# Patient Record
Sex: Female | Born: 1955 | Race: Black or African American | Hispanic: No | Marital: Single | Smoking: Never smoker
Health system: Southern US, Community
[De-identification: ages and names within clinical notes are randomized; demographics above are authoritative.]

## PROBLEM LIST (undated history)

## (undated) DIAGNOSIS — J45909 Unspecified asthma, uncomplicated: Secondary | ICD-10-CM

## (undated) DIAGNOSIS — I10 Essential (primary) hypertension: Secondary | ICD-10-CM

## (undated) DIAGNOSIS — J302 Other seasonal allergic rhinitis: Secondary | ICD-10-CM

## (undated) DIAGNOSIS — M1990 Unspecified osteoarthritis, unspecified site: Secondary | ICD-10-CM

## (undated) HISTORY — DX: Unspecified osteoarthritis, unspecified site: M19.90

## (undated) HISTORY — DX: Other seasonal allergic rhinitis: J30.2

## (undated) HISTORY — DX: Essential (primary) hypertension: I10

## (undated) HISTORY — PX: TUBAL LIGATION: SHX77

## (undated) HISTORY — DX: Unspecified asthma, uncomplicated: J45.909

---

## 2012-10-12 ENCOUNTER — Ambulatory Visit (INDEPENDENT_AMBULATORY_CARE_PROVIDER_SITE_OTHER): Payer: No Typology Code available for payment source | Admitting: Physician Assistant

## 2012-10-12 VITALS — BP 142/94 | HR 87 | Temp 98.2°F | Resp 18 | Ht 62.0 in | Wt 190.0 lb

## 2012-10-12 DIAGNOSIS — J9801 Acute bronchospasm: Secondary | ICD-10-CM

## 2012-10-12 DIAGNOSIS — R062 Wheezing: Secondary | ICD-10-CM

## 2012-10-12 DIAGNOSIS — J45901 Unspecified asthma with (acute) exacerbation: Secondary | ICD-10-CM

## 2012-10-12 DIAGNOSIS — J40 Bronchitis, not specified as acute or chronic: Secondary | ICD-10-CM

## 2012-10-12 DIAGNOSIS — R05 Cough: Secondary | ICD-10-CM

## 2012-10-12 DIAGNOSIS — I10 Essential (primary) hypertension: Secondary | ICD-10-CM

## 2012-10-12 MED ORDER — ALBUTEROL SULFATE HFA 108 (90 BASE) MCG/ACT IN AERS: 2.0000 | INHALATION_SPRAY | Freq: Four times a day (QID) | RESPIRATORY_TRACT | Status: DC | PRN

## 2012-10-12 MED ORDER — IPRATROPIUM BROMIDE 0.02 % IN SOLN
0.5000 mg | Freq: Once | RESPIRATORY_TRACT | Status: AC
  Administered 2012-10-12: 0.5 mg via RESPIRATORY_TRACT

## 2012-10-12 MED ORDER — IPRATROPIUM BROMIDE 0.06 % NA SOLN: 2.0000 | Freq: Three times a day (TID) | NASAL | Status: DC

## 2012-10-12 MED ORDER — CEFDINIR 300 MG PO CAPS: 300.0000 mg | ORAL_CAPSULE | Freq: Two times a day (BID) | ORAL | Status: DC

## 2012-10-12 MED ORDER — HYDROCODONE-HOMATROPINE 5-1.5 MG/5ML PO SYRP: ORAL_SOLUTION | ORAL | Status: DC

## 2012-10-12 MED ORDER — ALBUTEROL SULFATE (2.5 MG/3ML) 0.083% IN NEBU
2.5000 mg | INHALATION_SOLUTION | Freq: Once | RESPIRATORY_TRACT | Status: AC
  Administered 2012-10-12: 2.5 mg via RESPIRATORY_TRACT

## 2012-10-12 MED ORDER — HYDROCHLOROTHIAZIDE 25 MG PO TABS
25.0000 mg | ORAL_TABLET | Freq: Every day | ORAL | Status: DC
Start: 2012-10-12 — End: 2013-07-19

## 2012-10-12 MED ORDER — AMLODIPINE BESYLATE 10 MG PO TABS: 10.0000 mg | ORAL_TABLET | Freq: Every day | ORAL | Status: DC

## 2012-10-12 NOTE — Progress Notes (Signed)
Patient ID: Belinda Rodriguez MRN: [id number], DOB: [birth year], 57 y.o. Date of Encounter: 10/12/2012, 12:47 PM  Primary Physician: No primary provider on file.  Chief Complaint:  Chief Complaint  Patient presents with  . Cough    x yesterday  . Sinus Problem    pressure, ears itching    HPI: 57 y.o. female with history of well controlled allergies/asthma presents with a one day history of nasal congestion, post nasal drip, scratchy throat, and cough. Mild sinus pressure. Afebrile. No chills. Nasal congestion thick and green/yellow. Cough is productive of green/yellow sputum and worse as the day goes on. Mild wheezing. No chest pain or shortness of breath. Normal hearing. No otalgia. Has her albuterol inhaler 3 times the previous day with some success. She last used it the previous evening. No GI complaints. Appetite a little down today secondary to the increased post nasal drip. She does have a sick grand child. No recent antibiotics or recent travels. No leg trauma, sedentary periods, h/o cancer, or tobacco use.  Of note, her asthma typically only flares when her allergies become active which is around this time of year. At baseline she does not require the use of her albuterol inhaler. She requests a refill of her albuterol inhaler today. She also requests a refill of her antihypertensives as she has recently relocated her from near the [facility] [place] to help with her daughter's day-to-day tasks as her daughter treats her breast cancer.   Past Medical History  Diagnosis Date  . Arthritis   . Hypertension   . Asthma   . Seasonal allergies      Home Meds: Prior to Admission medications   Medication Sig Start Date End Date Taking? Authorizing Provider  albuterol (PROVENTIL HFA;VENTOLIN HFA) 108 (90 BASE) MCG/ACT inhaler Inhale 2 puffs into the lungs every 6 (six) hours as needed for wheezing.   Yes Historical Provider, MD  amLODipine (NORVASC) 10 MG tablet Take 10 mg by mouth daily.   Yes  Historical Provider, MD  Diclofenac-Misoprostol (ARTHROTEC PO) Take by mouth.   Yes Historical Provider, MD  hydrochlorothiazide (HYDRODIURIL) 25 MG tablet Take 25 mg by mouth daily.   Yes Historical Provider, MD  loratadine (CLARITIN) 10 MG tablet Take 10 mg by mouth daily.   Yes Historical Provider, MD  OMEPRAZOLE PO Take by mouth.   Yes Historical Provider, MD    Allergies:  Allergies  Allergen Reactions  . Nsaids   . Zestril (Lisinopril)     History   Social History  . Marital Status: Single    Spouse Name: N/A    Number of Children: N/A  . Years of Education: N/A   Occupational History  . Not on file.   Social History Main Topics  . Smoking status: Never Smoker   . Smokeless tobacco: Not on file  . Alcohol Use: No  . Drug Use: No  . Sexually Active: No   Other Topics Concern  . Not on file   Social History Narrative  . No narrative on file     Review of Systems: Constitutional: positive for fatigue. negative for chills, fever, night sweats, or weight changes HEENT: see above Cardiovascular: negative for chest pain or palpitations Respiratory: positive for cough and wheezing. negative for hemoptysis, tachypnea, dyspnea, or shortness of breath Abdominal: negative for abdominal pain, nausea, vomiting or diarrhea Dermatological: negative for rash Neurologic: negative for headache   Physical Exam: Blood pressure 142/94, pulse 87, temperature 98.2 F (36.8 C), temperature  source Oral, resp. rate 18, height 5\' 2"  (1.575 m), weight 190 lb (86.183 kg), SpO2 97.00%., Body mass index is 34.74 kg/(m^2). General: Well developed, well nourished, in no acute distress. Well appearing.  Head: Normocephalic, atraumatic, eyes without discharge, sclera non-icteric, nares are congested. No polyps. Bilateral auditory canals clear, TM's are without perforation, pearly grey with reflective cone of light bilaterally. Right maxillary sinus TTP. No frontal sinus TTP. Oral cavity moist,  dentition normal. Posterior pharynx with post nasal drip and mild erythema. No peritonsillar abscess or tonsillar exudate. Uvula midline.  Neck: Supple. No thyromegaly. Full ROM. No lymphadenopathy. Lungs: Coarse breath sounds bilaterally with mild expiratory wheezing. No rales or rhonchi. Breathing is unlabored. Status post albuterol and atrovent nebulizer patient feels much better. Lungs CTAB without wheezing.  Heart: RRR with S1 S2. No murmurs, rubs, or gallops appreciated. Msk:  Strength and tone normal for age. Extremities: No clubbing or cyanosis. No edema. Neuro: Alert and oriented X 3. Moves all extremities spontaneously. CNII-XII grossly in tact. Psych:  Responds to questions appropriately with a normal affect.     ASSESSMENT AND PLAN:  57 y.o. year old female with bronchitis/bronchospasm/cough and hypertension.  1) Bronchitis/bronchospasm/cough -Albuterol and Atrovent nebulizer in office  -Omnicef 300 mg 1 po bid #20 no RF  -Proventil 2 puffs inhaled q 4-6 hours prn #1 no RF -Hycodan #4oz 1 tsp po q 4-6 hours prn cough no RF SED -Atrovent NS 0.06% 2 sprays each nare bid prn #1 no RF -Mucinex -Tylenol/Motrin prn -Rest/fluids -RTC precautions -RTC 3-5 days if no improvement  2) Hypertension -Refilled meds until she can become established  -Norvasc 10 mg 1 po daily #30 RF 2 -HCTZ 25 mg 1 po daily #30 RF 2  Signed, Eula Listen, PA-C 10/12/2012 12:47 PM

## 2013-06-02 ENCOUNTER — Ambulatory Visit: Payer: Self-pay | Attending: Internal Medicine

## 2013-06-02 ENCOUNTER — Encounter (INDEPENDENT_AMBULATORY_CARE_PROVIDER_SITE_OTHER): Payer: Self-pay

## 2013-06-03 DIAGNOSIS — Z0271 Encounter for disability determination: Secondary | ICD-10-CM

## 2013-07-05 ENCOUNTER — Emergency Department (HOSPITAL_COMMUNITY)
Admission: EM | Admit: 2013-07-05 | Discharge: 2013-07-05 | Disposition: A | Discharge status: Home / Self Care | Payer: No Typology Code available for payment source | Source: Home / Self Care | Attending: Family Medicine | Admitting: Family Medicine

## 2013-07-05 ENCOUNTER — Encounter (HOSPITAL_COMMUNITY): Payer: Self-pay | Admitting: Emergency Medicine

## 2013-07-05 DIAGNOSIS — J019 Acute sinusitis, unspecified: Secondary | ICD-10-CM

## 2013-07-05 LAB — POCT RAPID STREP A: Streptococcus, Group A Screen (Direct): NEGATIVE

## 2013-07-05 MED ORDER — AMLODIPINE BESYLATE 10 MG PO TABS: 10.0000 mg | ORAL_TABLET | Freq: Every day | ORAL | Status: DC

## 2013-07-05 MED ORDER — HYDROCHLOROTHIAZIDE 25 MG PO TABS: 25.0000 mg | ORAL_TABLET | Freq: Every day | ORAL | Status: DC

## 2013-07-05 MED ORDER — MINOCYCLINE HCL 100 MG PO CAPS: 100.0000 mg | ORAL_CAPSULE | Freq: Two times a day (BID) | ORAL | Status: DC

## 2013-07-05 NOTE — ED Notes (Signed)
C/o sinus problems since Friday Admits nasal drainage, headache, sore throat, and cough

## 2013-07-05 NOTE — ED Provider Notes (Signed)
CSN: [id number]     Arrival date & time 07/05/13  1700 History   First MD Initiated Contact with Patient 07/05/13 1716     Chief Complaint  Patient presents with  . Facial Pain   (Consider location/radiation/quality/duration/timing/severity/associated sxs/prior Treatment) Patient is a 57 y.o. female presenting with URI.  URI Presenting symptoms: congestion, cough, facial pain and rhinorrhea   Presenting symptoms: no fever   Severity:  Mild Onset quality:  Gradual Duration:  3 days Progression:  Unchanged Chronicity:  New Relieved by:  None tried Worsened by:  Nothing tried Ineffective treatments:  None tried Associated symptoms: sinus pain     Past Medical History  Diagnosis Date  . Arthritis   . Hypertension   . Asthma   . Seasonal allergies    Past Surgical History  Procedure Laterality Date  . Tubal ligation     Family History  Problem Relation Age of Onset  . Hypertension Sister   . Diabetes Sister   . Hypertension Brother   . Cancer Daughter     breast   History  Substance Use Topics  . Smoking status: Never Smoker   . Smokeless tobacco: Not on file  . Alcohol Use: No   OB History   Grav Para Term Preterm Abortions TAB SAB Ect Mult Living                 Review of Systems  Constitutional: Negative.  Negative for fever.  HENT: Positive for congestion, postnasal drip and rhinorrhea. Negative for facial swelling.   Respiratory: Positive for cough.     Allergies  Nsaids and Zestril  Home Medications   Current Outpatient Rx  Name  Route  Sig  Dispense  Refill  . albuterol (PROVENTIL HFA;VENTOLIN HFA) 108 (90 BASE) MCG/ACT inhaler   Inhalation   Inhale 2 puffs into the lungs every 6 (six) hours as needed for wheezing.   1 Inhaler   2   . amLODipine (NORVASC) 10 MG tablet   Oral   Take 1 tablet (10 mg total) by mouth daily.   30 tablet   2   . cefdinir (OMNICEF) 300 MG capsule   Oral   Take 1 capsule (300 mg total) by mouth 2 (two) times  daily.   20 capsule   0   . Diclofenac-Misoprostol (ARTHROTEC PO)   Oral   Take by mouth.         . hydrochlorothiazide (HYDRODIURIL) 25 MG tablet   Oral   Take 1 tablet (25 mg total) by mouth daily.   30 tablet   2   . HYDROcodone-homatropine (HYCODAN) 5-1.5 MG/5ML syrup      1 TSP PO Q 4-6 HOURS PRN COUGH   120 mL   0   . ipratropium (ATROVENT) 0.06 % nasal spray   Nasal   Place 2 sprays into the nose 3 (three) times daily.   15 mL   0   . loratadine (CLARITIN) 10 MG tablet   Oral   Take 10 mg by mouth daily.         . minocycline (MINOCIN,DYNACIN) 100 MG capsule   Oral   Take 1 capsule (100 mg total) by mouth 2 (two) times daily.   20 capsule   0   . OMEPRAZOLE PO   Oral   Take by mouth.          BP 157/101  Pulse 115  Temp(Src) 98.3 F (36.8 C) (Oral)  Resp 20  SpO2  100% Physical Exam  Nursing note and vitals reviewed. Constitutional: She is oriented to person, place, and time. She appears well-developed and well-nourished.  HENT:  Head: Normocephalic.  Right Ear: External ear normal.  Left Ear: External ear normal.  Nose: Mucosal edema, rhinorrhea and sinus tenderness present. Right sinus exhibits maxillary sinus tenderness. Left sinus exhibits maxillary sinus tenderness.  Mouth/Throat: Oropharynx is clear and moist.  Neck: Normal range of motion. Neck supple.  Cardiovascular: Normal rate.   Pulmonary/Chest: Effort normal and breath sounds normal.  Lymphadenopathy:    She has no cervical adenopathy.  Neurological: She is alert and oriented to person, place, and time.  Skin: Skin is warm and dry.    ED Course  Procedures (including critical care time) Labs Review Labs Reviewed  POCT RAPID STREP A ([facility]ARE ONLY)   Imaging Review No results found.  EKG Interpretation    Date/Time:    Ventricular Rate:    PR Interval:    QRS Duration:   QT Interval:    QTC Calculation:   R Axis:     Text Interpretation:               MDM      Linna Hoff, MD 07/05/13 [phone]

## 2013-07-07 LAB — CULTURE, GROUP A STREP

## 2013-07-19 ENCOUNTER — Encounter: Payer: Self-pay | Admitting: Internal Medicine

## 2013-07-19 ENCOUNTER — Ambulatory Visit: Payer: No Typology Code available for payment source | Attending: Internal Medicine | Admitting: Internal Medicine

## 2013-07-19 VITALS — BP 134/90 | HR 108 | Temp 98.9°F | Resp 16 | Ht 62.0 in | Wt 206.0 lb

## 2013-07-19 DIAGNOSIS — J45901 Unspecified asthma with (acute) exacerbation: Secondary | ICD-10-CM | POA: Insufficient documentation

## 2013-07-19 DIAGNOSIS — J329 Chronic sinusitis, unspecified: Secondary | ICD-10-CM | POA: Insufficient documentation

## 2013-07-19 DIAGNOSIS — R059 Cough, unspecified: Secondary | ICD-10-CM | POA: Insufficient documentation

## 2013-07-19 DIAGNOSIS — R0982 Postnasal drip: Secondary | ICD-10-CM

## 2013-07-19 DIAGNOSIS — R05 Cough: Secondary | ICD-10-CM

## 2013-07-19 DIAGNOSIS — I10 Essential (primary) hypertension: Secondary | ICD-10-CM | POA: Insufficient documentation

## 2013-07-19 DIAGNOSIS — K219 Gastro-esophageal reflux disease without esophagitis: Secondary | ICD-10-CM

## 2013-07-19 DIAGNOSIS — M1990 Unspecified osteoarthritis, unspecified site: Secondary | ICD-10-CM

## 2013-07-19 DIAGNOSIS — R062 Wheezing: Secondary | ICD-10-CM

## 2013-07-19 LAB — CMP AND LIVER
ALT: 19 U/L (ref 0–35)
Albumin: 3.8 g/dL (ref 3.5–5.2)
Alkaline Phosphatase: 96 U/L (ref 39–117)
BUN: 15 mg/dL (ref 6–23)
Bilirubin, Direct: 0.1 mg/dL (ref 0.0–0.3)
CO2: 30 mEq/L (ref 19–32)
Glucose, Bld: 94 mg/dL (ref 70–99)
Indirect Bilirubin: 0.3 mg/dL (ref 0.0–0.9)
Potassium: 4.2 mEq/L (ref 3.5–5.3)
Sodium: 140 mEq/L (ref 135–145)
Total Protein: 7.5 g/dL (ref 6.0–8.3)

## 2013-07-19 LAB — CBC WITH DIFFERENTIAL/PLATELET
Basophils Absolute: 0.1 10*3/uL (ref 0.0–0.1)
Basophils Relative: 1 % (ref 0–1)
Eosinophils Absolute: 0.1 10*3/uL (ref 0.0–0.7)
Eosinophils Relative: 2 % (ref 0–5)
HCT: 37 % (ref 36.0–46.0)
Lymphocytes Relative: 47 % — ABNORMAL HIGH (ref 12–46)
Lymphs Abs: 2.4 10*3/uL (ref 0.7–4.0)
MCH: 30.6 pg (ref 26.0–34.0)
MCHC: 34.9 g/dL (ref 30.0–36.0)
Monocytes Absolute: 0.5 10*3/uL (ref 0.1–1.0)
Monocytes Relative: 9 % (ref 3–12)
Neutro Abs: 2 10*3/uL (ref 1.7–7.7)
Neutrophils Relative %: 41 % — ABNORMAL LOW (ref 43–77)
RBC: 4.22 MIL/uL (ref 3.87–5.11)
RDW: 13.2 % (ref 11.5–15.5)

## 2013-07-19 LAB — LIPID PANEL
HDL: 54 mg/dL (ref >39)
LDL Cholesterol: 133 mg/dL — ABNORMAL HIGH (ref 0–99)
VLDL: 17 mg/dL (ref 0–40)

## 2013-07-19 LAB — TSH: TSH: 1.513 u[IU]/mL (ref 0.350–4.500)

## 2013-07-19 MED ORDER — AMLODIPINE BESYLATE 10 MG PO TABS: 10.0000 mg | ORAL_TABLET | Freq: Every day | ORAL | Status: DC

## 2013-07-19 MED ORDER — IPRATROPIUM BROMIDE 0.06 % NA SOLN: 2.0000 | Freq: Three times a day (TID) | NASAL | Status: DC

## 2013-07-19 MED ORDER — DICLOFENAC-MISOPROSTOL 75-0.2 MG PO TBEC: 1.0000 | DELAYED_RELEASE_TABLET | Freq: Two times a day (BID) | ORAL | Status: DC

## 2013-07-19 MED ORDER — ALBUTEROL SULFATE HFA 108 (90 BASE) MCG/ACT IN AERS: 2.0000 | INHALATION_SPRAY | Freq: Four times a day (QID) | RESPIRATORY_TRACT | Status: DC | PRN

## 2013-07-19 MED ORDER — OMEPRAZOLE 20 MG PO CPDR: 20.0000 mg | DELAYED_RELEASE_CAPSULE | Freq: Two times a day (BID) | ORAL | Status: DC

## 2013-07-19 MED ORDER — LORATADINE 10 MG PO TABS: 10.0000 mg | ORAL_TABLET | Freq: Every day | ORAL | Status: DC

## 2013-07-19 MED ORDER — HYDROCHLOROTHIAZIDE 25 MG PO TABS: 25.0000 mg | ORAL_TABLET | Freq: Every day | ORAL | Status: DC

## 2013-07-19 NOTE — Progress Notes (Signed)
Patient ID: Zonia Caplin, female   DOB: October 15, 1955, 57 y.o.   MRN: [id number] Patient Demographics  Belinda Rodriguez, is a 57 y.o. female  WUJ:[id number]  NFA:[id number]  DOB - 03-30-1956  CC:  Chief Complaint  Patient presents with  . Establish Care       HPI: Belinda Rodriguez is a 57 y.o. female here today to establish medical care. Patient's past medical history significant for hypertension, asthma, osteoarthritis of the knee joints and seasonal allergies. She has not had primary care physician for a while, has not been on medications since then. She would like a refill of all her medications, in the proper physical examination. Her major complaint today is ongoing knee pains for which she takes pain medication but has run out. She claims that her blood pressure has been fairly within normal limits, her cough and wheezing has started to get worse than before, she usually gets it worse around this time of the year, but usually gets better with her breathing treatment which she doesn't have at the moment. Her daughter has breast cancer now doing better after surgery and chemotherapy. She does not smoke cigarette. Patient has No headache, No chest pain, No abdominal pain - No Nausea, No new weakness tingling or numbness, No Cough - SOB.  Allergies  Allergen Reactions  . Nsaids   . Zestril [Lisinopril]    Past Medical History  Diagnosis Date  . Arthritis   . Hypertension   . Asthma   . Seasonal allergies    Current Outpatient Prescriptions on File Prior to Visit  Medication Sig Dispense Refill  . cefdinir (OMNICEF) 300 MG capsule Take 1 capsule (300 mg total) by mouth 2 (two) times daily.  20 capsule  0  . HYDROcodone-homatropine (HYCODAN) 5-1.5 MG/5ML syrup 1 TSP PO Q 4-6 HOURS PRN COUGH  120 mL  0  . minocycline (MINOCIN,DYNACIN) 100 MG capsule Take 1 capsule (100 mg total) by mouth 2 (two) times daily.  20 capsule  0   No current facility-administered medications on file prior to visit.    Family History  Problem Relation Age of Onset  . Hypertension Sister   . Diabetes Sister   . Hypertension Brother   . Cancer Daughter     breast   History   Social History  . Marital Status: Single    Spouse Name: N/A    Number of Children: N/A  . Years of Education: N/A   Occupational History  . Not on file.   Social History Main Topics  . Smoking status: Never Smoker   . Smokeless tobacco: Not on file  . Alcohol Use: No  . Drug Use: No  . Sexual Activity: No   Other Topics Concern  . Not on file   Social History Narrative  . No narrative on file    Review of Systems: Constitutional: Negative for fever, chills, diaphoresis, activity change, appetite change and fatigue. HENT: Negative for ear pain, nosebleeds, congestion, facial swelling, rhinorrhea, neck pain, neck stiffness and ear discharge.  Eyes: Negative for pain, discharge, redness, itching and visual disturbance. Respiratory: Negative for cough, choking, chest tightness, shortness of breath, wheezing and stridor.  Cardiovascular: Negative for chest pain, palpitations and leg swelling. Gastrointestinal: Negative for abdominal distention. Genitourinary: Negative for dysuria, urgency, frequency, hematuria, flank pain, decreased urine volume, difficulty urinating and dyspareunia.  Musculoskeletal: Negative for back pain, joint swelling, arthralgia and gait problem. Neurological: Negative for dizziness, tremors, seizures, syncope, facial asymmetry, speech difficulty, weakness, light-headedness, numbness  and headaches.  Hematological: Negative for adenopathy. Does not bruise/bleed easily. Psychiatric/Behavioral: Negative for hallucinations, behavioral problems, confusion, dysphoric mood, decreased concentration and agitation.    Objective:   Filed Vitals:   07/19/13 1055  BP: 134/90  Pulse: 108  Temp: 98.9 F (37.2 C)  Resp: 16    Physical Exam: Constitutional: Patient appears well-developed and  well-nourished. No distress. HENT: Normocephalic, atraumatic, External right and left ear normal. Oropharynx is clear and moist.  Eyes: Conjunctivae and EOM are normal. PERRLA, no scleral icterus. Neck: Normal ROM. Neck supple. No JVD. No tracheal deviation. No thyromegaly. CVS: RRR, S1/S2 +, no murmurs, no gallops, no carotid bruit.  Pulmonary: Effort and breath sounds normal, no stridor, rhonchi, wheezes, rales.  Abdominal: Soft. BS +, no distension, tenderness, rebound or guarding.  Musculoskeletal: Normal range of motion. No edema and no tenderness.  Lymphadenopathy: No lymphadenopathy noted, cervical, inguinal or axillary Neuro: Alert. Normal reflexes, muscle tone coordination. No cranial nerve deficit. Skin: Skin is warm and dry. No rash noted. Not diaphoretic. No erythema. No pallor. Psychiatric: Normal mood and affect. Behavior, judgment, thought content normal.  No results found for this basename: WBC, HGB, HCT, MCV, PLT   No results found for this basename: CREATININE, BUN, NA, K, CL, CO2    No results found for this basename: HGBA1C   Lipid Panel  No results found for this basename: chol, trig, hdl, cholhdl, vldl, ldlcalc       Assessment and plan:   1. Sinusitis, chronic  - loratadine (CLARITIN) 10 MG tablet; Take 1 tablet (10 mg total) by mouth daily.  Dispense: 30 tablet; Refill: 3  2. Postnasal drip Trial of PPI as below Patient counseled  3. GERD (gastroesophageal reflux disease)  - omeprazole (PRILOSEC) 20 MG capsule; Take 1 capsule (20 mg total) by mouth 2 (two) times daily before a meal.  Dispense: 120 capsule; Refill: 3  4. Osteoarthritis Refill - Diclofenac-Misoprostol (ARTHROTEC) 75-0.2 MG TBEC; Take 1 tablet by mouth 2 (two) times daily.  Dispense: 60 tablet; Refill: 3  5. Essential hypertension, benign Labs: - CMP and Liver - CBC with Differential - Lipid Panel - Urinalysis, Complete - TSH - hydrochlorothiazide (HYDRODIURIL) 25 MG tablet; Take  1 tablet (25 mg total) by mouth daily.  Dispense: 90 tablet; Refill: 3 - amLODipine (NORVASC) 10 MG tablet; Take 1 tablet (10 mg total) by mouth daily.  Dispense: 90 tablet; Refill: 3  6. Cough  - ipratropium (ATROVENT) 0.06 % nasal spray; Place 2 sprays into the nose 3 (three) times daily.  Dispense: 15 mL; Refill: 3 - albuterol (PROVENTIL HFA;VENTOLIN HFA) 108 (90 BASE) MCG/ACT inhaler; Inhale 2 puffs into the lungs every 6 (six) hours as needed for wheezing.  Dispense: 2 Inhaler; Refill: 3  7. Wheezing  - albuterol (PROVENTIL HFA;VENTOLIN HFA) 108 (90 BASE) MCG/ACT inhaler; Inhale 2 puffs into the lungs every 6 (six) hours as needed for wheezing.  Dispense: 2 Inhaler; Refill: 3  8. Asthma with acute exacerbation  - albuterol (PROVENTIL HFA;VENTOLIN HFA) 108 (90 BASE) MCG/ACT inhaler; Inhale 2 puffs into the lungs every 6 (six) hours as needed for wheezing.  Dispense: 2 Inhaler; Refill: 3   Follow up in 3 months or when necessary   The patient was given clear instructions to go to ER or return to medical center if symptoms don't improve, worsen or new problems develop. The patient verbalized understanding. The patient was told to call to get lab results if they haven't heard anything in  the next week.     Jeanann Lewandowsky, MD, MHA, [facility] [place], [place] [phone]   07/19/2013, 11:45 AM

## 2013-07-19 NOTE — Progress Notes (Signed)
Pt is here to establish care. Pt is needing to get back on her medication. For 8 years pt has been dealing with pain from her right knee. Pt also reports having allergy's this time of year.

## 2013-07-19 NOTE — Patient Instructions (Signed)
DASH Diet The DASH diet stands for "Dietary Approaches to Stop Hypertension." It is a healthy eating plan that has been shown to reduce high blood pressure (hypertension) in as little as 14 days, while also possibly providing other significant health benefits. These other health benefits include reducing the risk of breast cancer after menopause and reducing the risk of type 2 diabetes, heart disease, colon cancer, and stroke. Health benefits also include weight loss and slowing kidney failure in patients with chronic kidney disease.  DIET GUIDELINES  Limit salt (sodium). Your diet should contain less than 1500 mg of sodium daily.  Limit refined or processed carbohydrates. Your diet should include mostly whole grains. Desserts and added sugars should be used sparingly.  Include small amounts of heart-healthy fats. These types of fats include nuts, oils, and tub margarine. Limit saturated and trans fats. These fats have been shown to be harmful in the body. CHOOSING FOODS  The following food groups are based on a 2000 calorie diet. See your Registered Dietitian for individual calorie needs. Grains and Grain Products (6 to 8 servings daily)  Eat More Often: Whole-wheat bread, brown rice, whole-grain or wheat pasta, quinoa, popcorn without added fat or salt (air popped).  Eat Less Often: White bread, white pasta, white rice, cornbread. Vegetables (4 to 5 servings daily)  Eat More Often: Fresh, frozen, and canned vegetables. Vegetables may be raw, steamed, roasted, or grilled with a minimal amount of fat.  Eat Less Often/Avoid: Creamed or fried vegetables. Vegetables in a cheese sauce. Fruit (4 to 5 servings daily)  Eat More Often: All fresh, canned (in natural juice), or frozen fruits. Dried fruits without added sugar. One hundred percent fruit juice ( cup [237 mL] daily).  Eat Less Often: Dried fruits with added sugar. Canned fruit in light or heavy syrup. Lean Meats, Fish, and Poultry (2  servings or less daily. One serving is 3 to 4 oz [85-114 g]).  Eat More Often: Ninety percent or leaner ground beef, tenderloin, sirloin. Round cuts of beef, chicken breast, turkey breast. All fish. Grill, bake, or broil your meat. Nothing should be fried.  Eat Less Often/Avoid: Fatty cuts of meat, turkey, or chicken leg, thigh, or wing. Fried cuts of meat or fish. Dairy (2 to 3 servings)  Eat More Often: Low-fat or fat-free milk, low-fat plain or light yogurt, reduced-fat or part-skim cheese.  Eat Less Often/Avoid: Milk (whole, 2%).Whole milk yogurt. Full-fat cheeses. Nuts, Seeds, and Legumes (4 to 5 servings per week)  Eat More Often: All without added salt.  Eat Less Often/Avoid: Salted nuts and seeds, canned beans with added salt. Fats and Sweets (limited)  Eat More Often: Vegetable oils, tub margarines without trans fats, sugar-free gelatin. Mayonnaise and salad dressings.  Eat Less Often/Avoid: Coconut oils, palm oils, butter, stick margarine, cream, half and half, cookies, candy, pie. FOR MORE INFORMATION The Dash Diet Eating Plan: www.dashdiet.org Document Released: 07/04/2011 Document Revised: 10/07/2011 Document Reviewed: 07/04/2011 [employer] Patient Information 2014 [employer]. Hypertension As your heart beats, it forces blood through your arteries. This force is your blood pressure. If the pressure is too high, it is called hypertension (HTN) or high blood pressure. HTN is dangerous because you may have it and not know it. High blood pressure may mean that your heart has to work harder to pump blood. Your arteries may be narrow or stiff. The extra work puts you at risk for heart disease, stroke, and other problems.  Blood pressure consists of two numbers, a   higher number over a lower, 110/72, for example. It is stated as "110 over 72." The ideal is below 120 for the top number (systolic) and under 80 for the bottom (diastolic). Write down your blood pressure today. You  should pay close attention to your blood pressure if you have certain conditions such as:  Heart failure.  Prior heart attack.  Diabetes  Chronic kidney disease.  Prior stroke.  Multiple risk factors for heart disease. To see if you have HTN, your blood pressure should be measured while you are seated with your arm held at the level of the heart. It should be measured at least twice. A one-time elevated blood pressure reading (especially in the Emergency Department) does not mean that you need treatment. There may be conditions in which the blood pressure is different between your right and left arms. It is important to see your caregiver soon for a recheck. Most people have essential hypertension which means that there is not a specific cause. This type of high blood pressure may be lowered by changing lifestyle factors such as:  Stress.  Smoking.  Lack of exercise.  Excessive weight.  Drug/tobacco/alcohol use.  Eating less salt. Most people do not have symptoms from high blood pressure until it has caused damage to the body. Effective treatment can often prevent, delay or reduce that damage. TREATMENT  When a cause has been identified, treatment for high blood pressure is directed at the cause. There are a large number of medications to treat HTN. These fall into several categories, and your caregiver will help you select the medicines that are best for you. Medications may have side effects. You should review side effects with your caregiver. If your blood pressure stays high after you have made lifestyle changes or started on medicines,   Your medication(s) may need to be changed.  Other problems may need to be addressed.  Be certain you understand your prescriptions, and know how and when to take your medicine.  Be sure to follow up with your caregiver within the time frame advised (usually within two weeks) to have your blood pressure rechecked and to review your  medications.  If you are taking more than one medicine to lower your blood pressure, make sure you know how and at what times they should be taken. Taking two medicines at the same time can result in blood pressure that is too low. SEEK IMMEDIATE MEDICAL CARE IF:  You develop a severe headache, blurred or changing vision, or confusion.  You have unusual weakness or numbness, or a faint feeling.  You have severe chest or abdominal pain, vomiting, or breathing problems. MAKE SURE YOU:   Understand these instructions.  Will watch your condition.  Will get help right away if you are not doing well or get worse. Document Released: 07/15/2005 Document Revised: 10/07/2011 Document Reviewed: 03/04/2008 [facility] Patient Information 2014 [place], [place]. Asthma Attack Prevention Although there is no way to prevent asthma from starting, you can take steps to control the disease and reduce its symptoms. Learn about your asthma and how to control it. Take an active role to control your asthma by working with your health care provider to create and follow an asthma action plan. An asthma action plan guides you in:  Taking your medicines properly.  Avoiding things that set off your asthma or make your asthma worse (asthma triggers).  Tracking your level of asthma control.  Responding to worsening asthma.  Seeking emergency care when needed. To  track your asthma, keep records of your symptoms, check your peak flow number using a handheld device that shows how well air moves out of your lungs (peak flow meter), and get regular asthma checkups.  WHAT ARE SOME WAYS TO PREVENT AN ASTHMA ATTACK?  Take medicines as directed by your health care provider.  Keep track of your asthma symptoms and level of control.  With your health care provider, write a detailed plan for taking medicines and managing an asthma attack. Then be sure to follow your action plan. Asthma is an ongoing condition that needs  regular monitoring and treatment.  Identify and avoid asthma triggers. Many outdoor allergens and irritants (such as pollen, mold, cold air, and air pollution) can trigger asthma attacks. Find out what your asthma triggers are and take steps to avoid them.  Monitor your breathing. Learn to recognize warning signs of an attack, such as coughing, wheezing, or shortness of breath. Your lung function may decrease before you notice any signs or symptoms, so regularly measure and record your peak airflow with a home peak flow meter.  Identify and treat attacks early. If you act quickly, you are less likely to have a severe attack. You will also need less medicine to control your symptoms. When your peak flow measurements decrease and alert you to an upcoming attack, take your medicine as instructed and immediately stop any activity that may have triggered the attack. If your symptoms do not improve, get medical help.  Pay attention to increasing quick-relief inhaler use. If you find yourself relying on your quick-relief inhaler, your asthma is not under control. See your health care provider about adjusting your treatment. WHAT CAN MAKE MY SYMPTOMS WORSE? A number of common things can set off or make your asthma symptoms worse and cause temporary increased inflammation of your airways. Keep track of your asthma symptoms for several weeks, detailing all the environmental and emotional factors that are linked with your asthma. When you have an asthma attack, go back to your asthma diary to see which factor, or combination of factors, might have contributed to it. Once you know what these factors are, you can take steps to control many of them. If you have allergies and asthma, it is important to take asthma prevention steps at home. Minimizing contact with the substance to which you are allergic will help prevent an asthma attack. Some triggers and ways to avoid these triggers are: Animal Dander:  Some people  are allergic to the flakes of skin or dried saliva from animals with fur or feathers.   There is no such thing as a hypoallergenic dog or cat breed. All dogs or cats can cause allergies, even if they don't shed.  Keep these pets out of your home.  If you are not able to keep a pet outdoors, keep the pet out of your bedroom and other sleeping areas at all times, and keep the door closed.  Remove carpets and furniture covered with cloth from your home. If that is not possible, keep the pet away from fabric-covered furniture and carpets. Dust Mites: Many people with asthma are allergic to dust mites. Dust mites are tiny bugs that are found in every home in mattresses, pillows, carpets, fabric-covered furniture, bedcovers, clothes, stuffed toys, and other fabric-covered items.   Cover your mattress in a special dust-proof cover.  Cover your pillow in a special dust-proof cover, or wash the pillow each week in hot water. Water must be hotter than 130 F (  54.4 C) to kill dust mites. Cold or warm water used with detergent and bleach can also be effective.  Wash the sheets and blankets on your bed each week in hot water.  Try not to sleep or lie on cloth-covered cushions.  Call ahead when traveling and ask for a smoke-free hotel room. Bring your own bedding and pillows in case the hotel only supplies feather pillows and down comforters, which may contain dust mites and cause asthma symptoms.  Remove carpets from your bedroom and those laid on concrete, if you can.  Keep stuffed toys out of the bed, or wash the toys weekly in hot water or cooler water with detergent and bleach. Cockroaches: Many people with asthma are allergic to the droppings and remains of cockroaches.   Keep food and garbage in closed containers. Never leave food out.  Use poison baits, traps, powders, gels, or paste (for example, boric acid).  If a spray is used to kill cockroaches, stay out of the room until the odor  goes away. Indoor Mold:  Fix leaky faucets, pipes, or other sources of water that have mold around them.  Clean floors and moldy surfaces with a fungicide or diluted bleach.  Avoid using humidifiers, vaporizers, or swamp coolers. These can spread molds through the air. Pollen and Outdoor Mold:  When pollen or mold spore counts are high, try to keep your windows closed.  Stay indoors with windows closed from late morning to afternoon. Pollen and some mold spore counts are highest at that time.  Ask your health care provider whether you need to take anti-inflammatory medicine or increase your dose of the medicine before your allergy season starts. Other Irritants to Avoid:  Tobacco smoke is an irritant. If you smoke, ask your health care provider how you can quit. Ask family members to quit smoking too. Do not allow smoking in your home or car.  If possible, do not use a wood-burning stove, kerosene heater, or fireplace. Minimize exposure to all sources of smoke, including to incense, candles, fires, and fireworks.  Try to stay away from strong odors and sprays, such as perfume, talcum powder, hair spray, and paints.  Decrease humidity in your home and use an indoor air cleaning device. Reduce indoor humidity to below 60%. Dehumidifiers or central air conditioners can do this.  Decrease house dust exposure by changing furnace and air cooler filters frequently.  Try to have someone else vacuum for you once or twice a week. Stay out of rooms while they are being vacuumed and for a short while afterward.  If you vacuum, use a dust mask from a hardware store, a double-layered or microfilter vacuum cleaner bag, or a vacuum cleaner with a HEPA filter.  Sulfites in foods and beverages can be irritants. Do not drink beer or wine or eat dried fruit, processed potatoes, or shrimp if they cause asthma symptoms.  Cold air can trigger an asthma attack. Cover your nose and mouth with a scarf on cold  or windy days.  Several health conditions can make asthma more difficult to manage, including a runny nose, sinus infections, reflux disease, psychological stress, and sleep apnea. Work with your health care provider to manage these conditions.  Avoid close contact with people who have a respiratory infection such as a cold or the flu, since your asthma symptoms may get worse if you catch the infection. Wash your hands thoroughly after touching items that may have been handled by people with a respiratory infection.  Get a flu shot every year to protect against the flu virus, which often makes asthma worse for days or weeks. Also get a pneumonia shot if you have not previously had one. Unlike the flu shot, the pneumonia shot does not need to be given yearly. Medicines:  Talk to your health care provider about whether it is safe for you to take aspirin or non-steroidal anti-inflammatory medicines (NSAIDs). In a small number of people with asthma, aspirin and NSAIDs can cause asthma attacks. These medicines must be avoided by people who have known aspirin-sensitive asthma. It is important that people with aspirin-sensitive asthma read labels of all over-the-counter medicines used to treat pain, colds, coughs, and fever.  Beta blockers and ACE inhibitors are other medicines you should discuss with your health care provider. HOW CAN I FIND OUT WHAT I AM ALLERGIC TO? Ask your asthma health care provider about allergy skin testing or blood testing (the RAST test) to identify the allergens to which you are sensitive. If you are found to have allergies, the most important thing to do is to try to avoid exposure to any allergens that you are sensitive to as much as possible. Other treatments for allergies, such as medicines and allergy shots (immunotherapy) are available.  CAN I EXERCISE? Follow your health care provider's advice regarding asthma treatment before exercising. It is important to maintain a  regular exercise program, but vigorous exercise, or exercise in cold, humid, or dry environments can cause asthma attacks, especially for those people who have exercise-induced asthma. Document Released: 07/03/2009 Document Revised: 03/17/2013 Document Reviewed: 01/20/2013 [facility] Patient Information 2014 [place], [place].

## 2013-07-20 LAB — URINALYSIS, COMPLETE
Bacteria, UA: NONE SEEN
Casts: NONE SEEN
Glucose, UA: NEGATIVE mg/dL
Leukocytes, UA: NEGATIVE
Nitrite: NEGATIVE
Specific Gravity, Urine: 1.028 (ref 1.005–1.030)
Urobilinogen, UA: 0.2 mg/dL (ref 0.0–1.0)
pH: 5.5 (ref 5.0–8.0)

## 2013-07-21 ENCOUNTER — Other Ambulatory Visit: Payer: Self-pay | Admitting: Internal Medicine

## 2013-07-21 ENCOUNTER — Ambulatory Visit (HOSPITAL_COMMUNITY)
Admission: RE | Admit: 2013-07-21 | Discharge: 2013-07-21 | Disposition: A | Discharge status: Home / Self Care | Payer: No Typology Code available for payment source | Source: Ambulatory Visit | Attending: Internal Medicine | Admitting: Internal Medicine

## 2013-07-21 DIAGNOSIS — M47814 Spondylosis without myelopathy or radiculopathy, thoracic region: Secondary | ICD-10-CM | POA: Insufficient documentation

## 2013-07-21 DIAGNOSIS — R062 Wheezing: Secondary | ICD-10-CM

## 2013-07-21 DIAGNOSIS — R05 Cough: Secondary | ICD-10-CM

## 2013-07-21 DIAGNOSIS — J45901 Unspecified asthma with (acute) exacerbation: Secondary | ICD-10-CM

## 2013-07-21 DIAGNOSIS — M1990 Unspecified osteoarthritis, unspecified site: Secondary | ICD-10-CM

## 2013-07-21 DIAGNOSIS — K449 Diaphragmatic hernia without obstruction or gangrene: Secondary | ICD-10-CM | POA: Insufficient documentation

## 2013-07-21 DIAGNOSIS — J45909 Unspecified asthma, uncomplicated: Secondary | ICD-10-CM | POA: Insufficient documentation

## 2013-07-21 MED ORDER — DICLOFENAC-MISOPROSTOL 75-0.2 MG PO TBEC: 1.0000 | DELAYED_RELEASE_TABLET | Freq: Two times a day (BID) | ORAL | Status: DC

## 2013-07-21 MED ORDER — ALBUTEROL SULFATE HFA 108 (90 BASE) MCG/ACT IN AERS: 2.0000 | INHALATION_SPRAY | Freq: Four times a day (QID) | RESPIRATORY_TRACT | Status: DC | PRN

## 2013-07-26 ENCOUNTER — Telehealth: Payer: Self-pay | Admitting: *Deleted

## 2013-07-26 NOTE — Telephone Encounter (Signed)
Message copied by Raynelle Chary on Mon Jul 26, 2013  9:41 AM ------      Message from: Jeanann Lewandowsky E      Created: Wed Jul 21, 2013  3:04 PM       Please let patient know that her chest x-ray is negative for active disease ------

## 2013-07-26 NOTE — Telephone Encounter (Signed)
I spoke to the pt and told her that her chest x-ray were negative for active disease.

## 2013-08-21 ENCOUNTER — Telehealth: Payer: Self-pay | Admitting: Emergency Medicine

## 2013-08-21 MED ORDER — ATORVASTATIN CALCIUM 10 MG PO TABS: 10.0000 mg | ORAL_TABLET | Freq: Every day | ORAL | Status: DC

## 2013-08-21 NOTE — Telephone Encounter (Signed)
Pt given lab results and informed she can pick script [facility]. Pt verbalized understanding Pt also encouraged to consume low fat diet with exerciser to improve results in 3-6 mnths.

## 2013-08-21 NOTE — Telephone Encounter (Signed)
Message copied by Darlis LoanSMITH, JILL D on Sat Aug 21, 2013 10:46 AM ------      Message from: Jeanann LewandowskyJEGEDE, OLUGBEMIGA E      Created: Fri Aug 20, 2013  5:20 PM       Please inform patient that her laboratory tests results show slightly elevated cholesterol level otherwise her results are mostly within normal limit. We will need to start her on low-dose cholesterol medication, I would still encourage her to continue regular exercise as tolerated and nutritional control of low cholesterol low fat diet.      Please call in Lipitor 10 mg tablet by mouth daily, 90 tablets with 3 refills ------

## 2013-08-21 NOTE — Telephone Encounter (Signed)
Message copied by Darlis LoanSMITH, Kamariah Fruchter D on Sat Aug 21, 2013 10:51 AM ------      Message from: Jeanann LewandowskyJEGEDE, OLUGBEMIGA E      Created: Fri Aug 20, 2013  5:20 PM       Please inform patient that her laboratory tests results show slightly elevated cholesterol level otherwise her results are mostly within normal limit. We will need to start her on low-dose cholesterol medication, I would still encourage her to continue regular exercise as tolerated and nutritional control of low cholesterol low fat diet.      Please call in Lipitor 10 mg tablet by mouth daily, 90 tablets with 3 refills ------

## 2013-10-18 ENCOUNTER — Ambulatory Visit: Payer: No Typology Code available for payment source | Attending: Internal Medicine | Admitting: Internal Medicine

## 2013-10-18 VITALS — BP 143/99 | HR 104 | Temp 97.9°F | Resp 16 | Ht 62.0 in | Wt 208.6 lb

## 2013-10-18 DIAGNOSIS — I10 Essential (primary) hypertension: Secondary | ICD-10-CM

## 2013-10-18 DIAGNOSIS — J329 Chronic sinusitis, unspecified: Secondary | ICD-10-CM

## 2013-10-18 DIAGNOSIS — R05 Cough: Secondary | ICD-10-CM

## 2013-10-18 DIAGNOSIS — R059 Cough, unspecified: Secondary | ICD-10-CM

## 2013-10-18 DIAGNOSIS — R062 Wheezing: Secondary | ICD-10-CM

## 2013-10-18 DIAGNOSIS — J45901 Unspecified asthma with (acute) exacerbation: Secondary | ICD-10-CM

## 2013-10-18 LAB — COMPLETE METABOLIC PANEL WITH GFR
ALT: 29 U/L (ref 0–35)
AST: 27 U/L (ref 0–37)
Albumin: 4.4 g/dL (ref 3.5–5.2)
Alkaline Phosphatase: 90 U/L (ref 39–117)
BUN: 17 mg/dL (ref 6–23)
CALCIUM: 10 mg/dL (ref 8.4–10.5)
CO2: 29 mEq/L (ref 19–32)
Chloride: 95 mEq/L — ABNORMAL LOW (ref 96–112)
Creat: 0.79 mg/dL (ref 0.50–1.10)
GFR, Est African American: >89 mL/min
GFR, Est Non African American: 82 mL/min
GLUCOSE: 94 mg/dL (ref 70–99)
POTASSIUM: 3.3 meq/L — AB (ref 3.5–5.3)
SODIUM: 139 meq/L (ref 135–145)
Total Bilirubin: 0.6 mg/dL (ref 0.2–1.2)
Total Protein: 8.4 g/dL — ABNORMAL HIGH (ref 6.0–8.3)

## 2013-10-18 LAB — LIPID PANEL
Cholesterol: 208 mg/dL — ABNORMAL HIGH (ref 0–200)
HDL: 73 mg/dL (ref >39)
LDL Cholesterol: 114 mg/dL — ABNORMAL HIGH (ref 0–99)
Total CHOL/HDL Ratio: 2.8 Ratio
Triglycerides: 104 mg/dL (ref <150)
VLDL: 21 mg/dL (ref 0–40)

## 2013-10-18 MED ORDER — ATORVASTATIN CALCIUM 10 MG PO TABS: 10.0000 mg | ORAL_TABLET | Freq: Every day | ORAL | Status: DC

## 2013-10-18 MED ORDER — ALBUTEROL SULFATE HFA 108 (90 BASE) MCG/ACT IN AERS: 2.0000 | INHALATION_SPRAY | Freq: Four times a day (QID) | RESPIRATORY_TRACT | Status: DC | PRN

## 2013-10-18 MED ORDER — FLUTICASONE PROPIONATE 50 MCG/ACT NA SUSP: 2.0000 | Freq: Every day | NASAL | Status: DC

## 2013-10-18 MED ORDER — AMLODIPINE BESYLATE 10 MG PO TABS: 10.0000 mg | ORAL_TABLET | Freq: Every day | ORAL | Status: DC

## 2013-10-18 MED ORDER — HYDROCHLOROTHIAZIDE 25 MG PO TABS: 25.0000 mg | ORAL_TABLET | Freq: Every day | ORAL | Status: DC

## 2013-10-18 MED ORDER — LORATADINE 10 MG PO TABS: 10.0000 mg | ORAL_TABLET | Freq: Every day | ORAL | Status: DC

## 2013-10-18 MED ORDER — IPRATROPIUM BROMIDE 0.06 % NA SOLN: 2.0000 | Freq: Three times a day (TID) | NASAL | Status: DC

## 2013-10-18 NOTE — Progress Notes (Signed)
Patient ID: Belinda Rodriguez, female   DOB: [birth year], 58 y.o.   MRN: [id number]   CC:  HPI: 58 year old with a history of hypertension, dyslipidemia, presents with a chief complaint of seasonal allergies and rhinitis. She has been using albuterol and Atrovent for her nasal allergies with minimal relief she is also on Claritin. She's also concerned about weight gain and states that she has gained about 2 pounds. She reports her diet to be moderate. Denies any chest pain shortness of breath dependent edema    Allergies  Allergen Reactions  . Nsaids   . Zestril [Lisinopril]    Past Medical History  Diagnosis Date  . Arthritis   . Hypertension   . Asthma   . Seasonal allergies    Current Outpatient Prescriptions on File Prior to Visit  Medication Sig Dispense Refill  . Diclofenac-Misoprostol (ARTHROTEC) 75-0.2 MG TBEC Take 1 tablet by mouth 2 (two) times daily.  180 tablet  4  . omeprazole (PRILOSEC) 20 MG capsule Take 1 capsule (20 mg total) by mouth 2 (two) times daily before a meal.  120 capsule  3   No current facility-administered medications on file prior to visit.   Family History  Problem Relation Age of Onset  . Hypertension Sister   . Diabetes Sister   . Hypertension Brother   . Cancer Daughter     breast   History   Social History  . Marital Status: Single    Spouse Name: N/A    Number of Children: N/A  . Years of Education: N/A   Occupational History  . Not on file.   Social History Main Topics  . Smoking status: Never Smoker   . Smokeless tobacco: Not on file  . Alcohol Use: No  . Drug Use: No  . Sexual Activity: No   Other Topics Concern  . Not on file   Social History Narrative  . No narrative on file    Review of Systems  Constitutional: Negative for fever, chills, diaphoresis, activity change, appetite change and fatigue.  HENT: Negative for ear pain, nosebleeds, congestion, facial swelling, rhinorrhea, neck pain, neck stiffness and ear  discharge.   Eyes: Negative for pain, discharge, redness, itching and visual disturbance.  Respiratory: Negative for cough, choking, chest tightness, shortness of breath, wheezing and stridor.   Cardiovascular: Negative for chest pain, palpitations and leg swelling.  Gastrointestinal: Negative for abdominal distention.  Genitourinary: Negative for dysuria, urgency, frequency, hematuria, flank pain, decreased urine volume, difficulty urinating and dyspareunia.  Musculoskeletal: Negative for back pain, joint swelling, arthralgias and gait problem.  Neurological: Negative for dizziness, tremors, seizures, syncope, facial asymmetry, speech difficulty, weakness, light-headedness, numbness and headaches.  Hematological: Negative for adenopathy. Does not bruise/bleed easily.  Psychiatric/Behavioral: Negative for hallucinations, behavioral problems, confusion, dysphoric mood, decreased concentration and agitation.    Objective:   Filed Vitals:   10/18/13 1037  BP: 143/99  Pulse: 104  Temp: 97.9 F (36.6 C)  Resp: 16    Physical Exam  Constitutional: Appears well-developed and well-nourished. No distress.  HENT: Normocephalic. External right and left ear normal. Oropharynx is clear and moist.  Eyes: Conjunctivae and EOM are normal. PERRLA, no scleral icterus.  Neck: Normal ROM. Neck supple. No JVD. No tracheal deviation. No thyromegaly.  CVS: RRR, S1/S2 +, no murmurs, no gallops, no carotid bruit.  Pulmonary: Effort and breath sounds normal, no stridor, rhonchi, wheezes, rales.  Abdominal: Soft. BS +,  no distension, tenderness, rebound or guarding.  Musculoskeletal: Normal range  of motion. No edema and no tenderness.  Lymphadenopathy: No lymphadenopathy noted, cervical, inguinal. Neuro: Alert. Normal reflexes, muscle tone coordination. No cranial nerve deficit. Skin: Skin is warm and dry. No rash noted. Not diaphoretic. No erythema. No pallor.  Psychiatric: Normal mood and affect.  Behavior, judgment, thought content normal.   Lab Results  Component Value Date   WBC 5.0 07/19/2013   HGB 12.9 07/19/2013   HCT 37.0 07/19/2013   MCV 87.7 07/19/2013   PLT 341 07/19/2013   Lab Results  Component Value Date   CREATININE 0.71 07/19/2013   BUN 15 07/19/2013   NA 140 07/19/2013   K 4.2 07/19/2013   CL 98 07/19/2013   CO2 30 07/19/2013    No results found for this basename: HGBA1C   Lipid Panel     Component Value Date/Time   CHOL 204* 07/19/2013 1147   TRIG 85 07/19/2013 1147   HDL 54 07/19/2013 1147   CHOLHDL 3.8 07/19/2013 1147   VLDL 17 07/19/2013 1147   LDLCALC 133* 07/19/2013 1147       Assessment and plan:   Patient Active Problem List   Diagnosis Date Noted  . Osteoarthritis 07/19/2013  . GERD (gastroesophageal reflux disease) 07/19/2013  . Postnasal drip 07/19/2013  . Sinusitis, chronic 07/19/2013  . Essential hypertension, benign 07/19/2013  . Asthma with acute exacerbation 07/19/2013  . Wheezing 07/19/2013  . Cough 07/19/2013       Hypertension Controlled Continue HCTZ   Allergic rhinitis Start the patient on Flonase continue Claritin If  she develops allergic conjunctivitis she has been asked to use visine eye drops  Weight gain Patient recommended to start nonweightbearing exercises Preferably pool exercises Check TSH    Dyslipidemia Check lipid panel today if  Followup in 3 months   The patient was given clear instructions to go to ER or return to medical center if symptoms don't improve, worsen or new problems develop. The patient verbalized understanding. The patient was told to call to get any lab results if not heard anything in the next week.

## 2013-10-18 NOTE — Progress Notes (Signed)
Here for follow up States she does have a scratchy throat does think its due to allergies Has refilled allergy medication Is experiencing some SOB   States she would like to loss weight Admits to only eating once/twice daily

## 2013-10-19 LAB — TSH: TSH: 2.024 u[IU]/mL (ref 0.350–4.500)

## 2013-10-20 MED ORDER — POTASSIUM CHLORIDE ER 20 MEQ PO TBCR: 20.0000 meq | EXTENDED_RELEASE_TABLET | Freq: Every day | ORAL | Status: DC

## 2013-10-20 NOTE — Addendum Note (Signed)
Addended by: Susie CassetteABROL MD, Germain OsgoodNAYANA on: 10/20/2013 10:48 AM   Modules accepted: Orders

## 2013-10-26 ENCOUNTER — Telehealth: Payer: Self-pay | Admitting: Emergency Medicine

## 2013-10-26 NOTE — Telephone Encounter (Signed)
Pt given results with medication instructions

## 2013-10-26 NOTE — Telephone Encounter (Signed)
Message copied by Darlis LoanSMITH, JILL D on Tue Oct 26, 2013  2:23 PM ------      Message from: Susie CassetteABROL MD, Germain OsgoodNAYANA      Created: Wed Oct 20, 2013 10:49 AM       Notify patient potassium is 3.3. [facility]. ------

## 2013-11-25 ENCOUNTER — Telehealth: Payer: Self-pay | Admitting: *Deleted

## 2013-11-25 ENCOUNTER — Other Ambulatory Visit: Payer: Self-pay | Admitting: *Deleted

## 2013-11-25 DIAGNOSIS — J45901 Unspecified asthma with (acute) exacerbation: Secondary | ICD-10-CM

## 2013-11-25 DIAGNOSIS — R059 Cough, unspecified: Secondary | ICD-10-CM

## 2013-11-25 DIAGNOSIS — R062 Wheezing: Secondary | ICD-10-CM

## 2013-11-25 DIAGNOSIS — R05 Cough: Secondary | ICD-10-CM

## 2013-11-25 DIAGNOSIS — I10 Essential (primary) hypertension: Secondary | ICD-10-CM

## 2013-11-25 DIAGNOSIS — Z9109 Other allergy status, other than to drugs and biological substances: Secondary | ICD-10-CM

## 2013-11-25 DIAGNOSIS — M1990 Unspecified osteoarthritis, unspecified site: Secondary | ICD-10-CM

## 2013-11-25 DIAGNOSIS — E785 Hyperlipidemia, unspecified: Secondary | ICD-10-CM

## 2013-11-25 MED ORDER — AMLODIPINE BESYLATE 10 MG PO TABS: 10.0000 mg | ORAL_TABLET | Freq: Every day | ORAL | Status: DC

## 2013-11-25 MED ORDER — FLUTICASONE PROPIONATE 50 MCG/ACT NA SUSP: 2.0000 | Freq: Every day | NASAL | Status: DC

## 2013-11-25 MED ORDER — POTASSIUM CHLORIDE ER 20 MEQ PO TBCR: 20.0000 meq | EXTENDED_RELEASE_TABLET | Freq: Every day | ORAL | Status: DC

## 2013-11-25 MED ORDER — HYDROCHLOROTHIAZIDE 25 MG PO TABS: 25.0000 mg | ORAL_TABLET | Freq: Every day | ORAL | Status: DC

## 2013-11-25 MED ORDER — IPRATROPIUM BROMIDE 0.06 % NA SOLN: 2.0000 | Freq: Three times a day (TID) | NASAL | Status: DC

## 2013-11-25 MED ORDER — ALBUTEROL SULFATE HFA 108 (90 BASE) MCG/ACT IN AERS: 2.0000 | INHALATION_SPRAY | Freq: Four times a day (QID) | RESPIRATORY_TRACT | Status: DC | PRN

## 2013-11-25 MED ORDER — ATORVASTATIN CALCIUM 10 MG PO TABS: 10.0000 mg | ORAL_TABLET | Freq: Every day | ORAL | Status: DC

## 2013-11-25 NOTE — Telephone Encounter (Signed)
Patient returned called stating her preferred pharmacy is [facility]. Informed patient we will send her prescriptions to [facility]. Reather LaurenceJamie R Makinzi, RN

## 2013-11-25 NOTE — Telephone Encounter (Signed)
Left a message for patient to call back in regards to pharmacy and medications. Reather LaurenceJamie R Darneisha, RN

## 2014-01-18 ENCOUNTER — Encounter: Payer: Self-pay | Admitting: Internal Medicine

## 2014-01-18 ENCOUNTER — Ambulatory Visit: Payer: No Typology Code available for payment source | Attending: Internal Medicine | Admitting: Internal Medicine

## 2014-01-18 VITALS — BP 123/88 | HR 94 | Temp 98.6°F | Resp 16 | Ht 62.0 in | Wt 208.0 lb

## 2014-01-18 DIAGNOSIS — I10 Essential (primary) hypertension: Secondary | ICD-10-CM

## 2014-01-18 DIAGNOSIS — J45909 Unspecified asthma, uncomplicated: Secondary | ICD-10-CM | POA: Insufficient documentation

## 2014-01-18 DIAGNOSIS — Z1231 Encounter for screening mammogram for malignant neoplasm of breast: Secondary | ICD-10-CM | POA: Insufficient documentation

## 2014-01-18 DIAGNOSIS — Z1211 Encounter for screening for malignant neoplasm of colon: Secondary | ICD-10-CM

## 2014-01-18 LAB — BASIC METABOLIC PANEL WITH GFR
BUN: 21 mg/dL (ref 6–23)
CHLORIDE: 97 meq/L (ref 96–112)
CO2: 30 mEq/L (ref 19–32)
Calcium: 9.4 mg/dL (ref 8.4–10.5)
Creat: 0.81 mg/dL (ref 0.50–1.10)
GFR, EST NON AFRICAN AMERICAN: 79 mL/min
Glucose, Bld: 95 mg/dL (ref 70–99)
POTASSIUM: 3.4 meq/L — AB (ref 3.5–5.3)
SODIUM: 138 meq/L (ref 135–145)

## 2014-01-18 NOTE — Progress Notes (Signed)
Patient ID: Belinda Rodriguez, female   DOB: 03/18/1956, 58 y.o.   MRN: [id number]   Belinda AyeRose Mays, is a 58 y.o. female  [id number]  NFA:[id number]  DOB - 11/30/1955  Chief Complaint  Patient presents with  . Follow-up        Subjective:   Belinda AyeRose Boulter is a 58 y.o. female here today for a follow up visit. Pt is here following up on her HTN and asthma. Pt reports that when she uses her nasal spray her nose gets dry and bleeds. She uses 2 nasal spray and that it makes her nose and dry and sometimes bleed. Pt states that she has been having painful back spasms. She reports no other side effects to medications. Her last blood draw showed low potassium, and she has been on potassium supplement but no repeat lab. She does not smoke cigarette she does not drink alcohol. Patient is not up to date on her preventative care including colonoscopy and mammogram. But her Pap smear was normal and will be due for another one next year. Patient has No headache, No chest pain, No abdominal pain - No Nausea, No new weakness tingling or numbness, No Cough - SOB.  Problem  Colon Cancer Screening  Other Screening Mammogram    ALLERGIES: Allergies  Allergen Reactions  . Nsaids   . Zestril [Lisinopril]     PAST MEDICAL HISTORY: Past Medical History  Diagnosis Date  . Arthritis   . Hypertension   . Asthma   . Seasonal allergies     MEDICATIONS AT HOME: Prior to Admission medications   Medication Sig Start Date End Date Taking? Authorizing Provider  albuterol (PROVENTIL HFA;VENTOLIN HFA) 108 (90 BASE) MCG/ACT inhaler Inhale 2 puffs into the lungs every 6 (six) hours as needed for wheezing. 11/25/13  Yes Jeanann Lewandowskylugbemiga Latravis Grine, MD  amLODipine (NORVASC) 10 MG tablet Take 1 tablet (10 mg total) by mouth daily. 11/25/13  Yes Jeanann Lewandowskylugbemiga Ameliana Brashear, MD  atorvastatin (LIPITOR) 10 MG tablet Take 1 tablet (10 mg total) by mouth daily. 11/25/13  Yes Jeanann Lewandowskylugbemiga Dahl Higinbotham, MD  Diclofenac-Misoprostol (ARTHROTEC) 75-0.2 MG TBEC Take 1  tablet by mouth 2 (two) times daily. 07/21/13  Yes Jeanann Lewandowskylugbemiga Twain Stenseth, MD  fluticasone (FLONASE) 50 MCG/ACT nasal spray Place 2 sprays into both nostrils daily. 11/25/13  Yes Jeanann Lewandowskylugbemiga Liani Caris, MD  hydrochlorothiazide (HYDRODIURIL) 25 MG tablet Take 1 tablet (25 mg total) by mouth daily. 11/25/13  Yes Jeanann Lewandowskylugbemiga Ambriana Selway, MD  loratadine (CLARITIN) 10 MG tablet Take 1 tablet (10 mg total) by mouth daily. 10/18/13  Yes Richarda OverlieNayana Abrol, MD  omeprazole (PRILOSEC) 20 MG capsule Take 1 capsule (20 mg total) by mouth 2 (two) times daily before a meal. 07/19/13  Yes Jeanann Lewandowskylugbemiga Laurette Villescas, MD  Potassium Chloride ER 20 MEQ TBCR Take 20 mEq by mouth daily. 11/25/13  Yes Jeanann Lewandowskylugbemiga Ernisha Sorn, MD     Objective:   Filed Vitals:   01/18/14 1100  BP: 123/88  Pulse: 94  Temp: 98.6 F (37 C)  TempSrc: Oral  Resp: 16  Height: 5\' 2"  (1.575 m)  Weight: 208 lb (94.348 kg)  SpO2: 95%    Exam General appearance : Awake, alert, not in any distress. Speech Clear. Not toxic looking HEENT: Atraumatic and Normocephalic, pupils equally reactive to light and accomodation Neck: supple, no JVD. No cervical lymphadenopathy.  Chest:Good air entry bilaterally, no added sounds  CVS: S1 S2 regular, no murmurs.  Abdomen: Bowel sounds present, Non tender and not distended with no gaurding, rigidity or rebound. Extremities: B/L Lower  Ext shows no edema, both legs are warm to touch Neurology: Awake alert, and oriented X 3, CN II-XII intact, Non focal Skin:No Rash Wounds:N/A  Data Review No results found for this basename: HGBA1C     Assessment & Plan   1. Colon cancer screening  - HM COLONOSCOPY - Ambulatory referral to Gastroenterology  2. Other screening mammogram  - MM DIGITAL SCREENING BILATERAL; Future  3. Essential hypertension, benign  - BASIC METABOLIC PANEL WITH GFR to evaluate for hypokalemia  Patient was extensively counseled on nutrition and exercise Patient has been educated on the use of nasal spray, I  have discontinued the Flovent nasal spray, to continue Flonase for now, if dryness and epistaxis continues, stopped using Flonase as well.  Return in about 6 months (around 07/20/2014) for Follow up HTN, Follow up Pain and comorbidities.  The patient was given clear instructions to go to ER or return to medical center if symptoms don't improve, worsen or new problems develop. The patient verbalized understanding. The patient was told to call to get lab results if they haven't heard anything in the next week.   This note has been created with Education officer, environmentalDragon speech recognition software and smart phrase technology. Any transcriptional errors are unintentional.    Jeanann LewandowskyJEGEDE, Gaberiel Youngblood, MD, MHA, FACP, FAAP [facility] [place], [place] [phone]   01/18/2014, 11:38 AM

## 2014-01-18 NOTE — Progress Notes (Signed)
Pt is here following up on her HTN and asthma. Pt reports that when she uses her nasal spray her nose gets dry and bleeds. Pt states that she has been having painful back spasms. Pt sai that she is having bad gas and that she get dizzy.

## 2014-02-11 ENCOUNTER — Telehealth: Payer: Self-pay

## 2014-02-11 NOTE — Telephone Encounter (Signed)
Message copied by Lestine MountJUAREZ, Ranen Doolin L on Fri Feb 11, 2014  2:06 PM ------      Message from: Jeanann LewandowskyJEGEDE, OLUGBEMIGA E      Created: Thu Feb 10, 2014  7:25 AM       Please inform patient that her but basic metabolic panel is normal although her potassium is slightly low,so she should continue the potassium supplement ------

## 2014-02-11 NOTE — Telephone Encounter (Signed)
Patient is aware of her lab results 

## 2014-03-17 ENCOUNTER — Ambulatory Visit (HOSPITAL_COMMUNITY): Payer: No Typology Code available for payment source

## 2014-04-28 ENCOUNTER — Ambulatory Visit: Payer: No Typology Code available for payment source | Attending: Internal Medicine | Admitting: Internal Medicine

## 2014-04-28 ENCOUNTER — Encounter: Payer: Self-pay | Admitting: Internal Medicine

## 2014-04-28 VITALS — BP 125/86 | HR 95 | Temp 98.6°F | Resp 16 | Ht 62.0 in | Wt 205.0 lb

## 2014-04-28 DIAGNOSIS — Z79899 Other long term (current) drug therapy: Secondary | ICD-10-CM | POA: Diagnosis not present

## 2014-04-28 DIAGNOSIS — G5601 Carpal tunnel syndrome, right upper limb: Secondary | ICD-10-CM | POA: Diagnosis not present

## 2014-04-28 DIAGNOSIS — K219 Gastro-esophageal reflux disease without esophagitis: Secondary | ICD-10-CM | POA: Insufficient documentation

## 2014-04-28 DIAGNOSIS — G5603 Carpal tunnel syndrome, bilateral upper limbs: Secondary | ICD-10-CM

## 2014-04-28 DIAGNOSIS — M1990 Unspecified osteoarthritis, unspecified site: Secondary | ICD-10-CM | POA: Diagnosis not present

## 2014-04-28 DIAGNOSIS — E876 Hypokalemia: Secondary | ICD-10-CM | POA: Insufficient documentation

## 2014-04-28 DIAGNOSIS — I10 Essential (primary) hypertension: Secondary | ICD-10-CM | POA: Insufficient documentation

## 2014-04-28 DIAGNOSIS — Z23 Encounter for immunization: Secondary | ICD-10-CM | POA: Diagnosis not present

## 2014-04-28 DIAGNOSIS — J45909 Unspecified asthma, uncomplicated: Secondary | ICD-10-CM | POA: Insufficient documentation

## 2014-04-28 DIAGNOSIS — G5602 Carpal tunnel syndrome, left upper limb: Secondary | ICD-10-CM | POA: Diagnosis not present

## 2014-04-28 DIAGNOSIS — Z791 Long term (current) use of non-steroidal anti-inflammatories (NSAID): Secondary | ICD-10-CM | POA: Insufficient documentation

## 2014-04-28 LAB — BASIC METABOLIC PANEL WITH GFR
BUN: 15 mg/dL (ref 6–23)
CO2: 24 meq/L (ref 19–32)
Calcium: 9.7 mg/dL (ref 8.4–10.5)
Chloride: 98 meq/L (ref 96–112)
Creat: 1.01 mg/dL (ref 0.50–1.10)
Glucose, Bld: 92 mg/dL (ref 70–99)
Potassium: 3.5 meq/L (ref 3.5–5.3)
Sodium: 141 meq/L (ref 135–145)

## 2014-04-28 NOTE — Progress Notes (Signed)
Patient ID: Belinda Rodriguez, female   DOB: 02/07/1956, 58 y.o.   MRN: [id number]   Belinda Rodriguez, is a 58 y.o. female  [id number]  NFA:[id number]  DOB - 02/04/1956  Chief Complaint  Patient presents with  . Follow-up        Subjective:   Belinda Rodriguez is a 58 y.o. female here today for a follow up visit. PMH significant for HTN, sinusitis, GERD, asthma, and OA.  She has no complaints regarding her HTN or medication regimen, and reports compliance with her medications.  She denies CP, SOB, DOE.  She states that her legs occasionally swell with prolonged standing/walking.  She follows a heart healthy diet and reports a 3 lb weight loss.  She does not currently exercise.  She endorses chronic allergies which lead to sinus headaches.  She does not take medication for her headaches and declines treatment at this time.  She also complains of carpal tunnel-like symptoms.  She recently had a home visit from a provider with her insurance company who preformed the Phalens test, which resulted in numbness in her finger tips.  She was encouraged to see her PCP regarding these findings.  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 She states that the numbness in her fingers is worse at night when she is resting.  Movement of her fingers relieves these feelings.  She denies headache, abdominal pain, nausea, cough, and SOB. Patient has No headache, No chest pain, No abdominal pain - No Nausea, No new weakness tingling or numbness, No Cough - SOB.  No problems updated.  ALLERGIES: Allergies  Allergen Reactions  . Nsaids   . Zestril [Lisinopril]     PAST MEDICAL HISTORY: Past Medical History  Diagnosis Date  . Arthritis   . Hypertension   . Asthma   . Seasonal allergies     MEDICATIONS AT HOME: Prior to Admission medications   Medication Sig Start Date End Date Taking? Authorizing Provider  albuterol (PROVENTIL HFA;VENTOLIN HFA) 108 (90 BASE) MCG/ACT inhaler  Inhale 2 puffs into the lungs every 6 (six) hours as needed for wheezing. 11/25/13  Yes Quentin Angstlugbemiga E Joyice Magda, MD  amLODipine (NORVASC) 10 MG tablet Take 1 tablet (10 mg total) by mouth daily. 11/25/13  Yes Quentin Angstlugbemiga E Dainel Arcidiacono, MD  atorvastatin (LIPITOR) 10 MG tablet Take 1 tablet (10 mg total) by mouth daily. 11/25/13  Yes Quentin Angstlugbemiga E Lulu Hirschmann, MD  Diclofenac-Misoprostol (ARTHROTEC) 75-0.2 MG TBEC Take 1 tablet by mouth 2 (two) times daily. 07/21/13  Yes Quentin Angstlugbemiga E Nyzir Dubois, MD  fluticasone (FLONASE) 50 MCG/ACT nasal spray Place 2 sprays into both nostrils daily. 11/25/13  Yes Quentin Angstlugbemiga E Pollyann Roa, MD  hydrochlorothiazide (HYDRODIURIL) 25 MG tablet Take 1 tablet (25 mg total) by mouth daily. 11/25/13  Yes Quentin Angstlugbemiga E Kaitlen Redford, MD  loratadine (CLARITIN) 10 MG tablet Take 1 tablet (10 mg total) by mouth daily. 10/18/13  Yes Richarda OverlieNayana Abrol, MD  omeprazole (PRILOSEC) 20 MG capsule Take 1 capsule (20 mg total) by mouth 2 (two) times daily before a meal. 07/19/13  Yes Quentin Angstlugbemiga E Dorcus Riga, MD  Potassium Chloride ER 20 MEQ TBCR Take 20 mEq by mouth daily. 11/25/13  Yes Quentin Angstlugbemiga E Orason Jon, MD     Objective:   Filed Vitals:   04/28/14 1029  BP: 125/86  Pulse: 95  Temp: 98.6 F (37 C)  TempSrc: Oral  Resp: 16  Height: 5\' 2"  (1.575 m)  Weight: 205 lb (92.987 kg)  SpO2: 97%  Exam General appearance : Awake, alert, not in any distress. Speech Clear. Not toxic looking HEENT: Atraumatic and Normocephalic, pupils equally reactive to light and accomodation Neck: supple, no JVD. No cervical lymphadenopathy.  Chest:Good air entry bilaterally, no added sounds  CVS: S1 S2 regular, no murmurs.  Abdomen: Bowel sounds present, Non tender and not distended with no gaurding, rigidity or rebound. Extremities: B/L Lower Ext shows no edema, both legs are warm to touch Neurology: Awake alert, and oriented X 3, CN II-XII intact, Non focal Skin:No Rash Wounds:N/A  Data Review No results found for this basename: HGBA1C       Assessment & Plan   1. Essential hypertension  Blood pressure is well controlled, continue current regimen DASH diet discuss.  2. Hypokalemia  - Basic Metabolic Panel  3. Bilateral carpal tunnel syndrome  Patient is to use wrist splint mostly at night, we will try this for about 3-6 months, if no improvement will refer to surgery for possible carpal tunnel release  Patient was counseled extensively about nutrition and exercise   Return in about 6 months (around 10/28/2014), or if symptoms worsen or fail to improve, for Annual Physical, Follow up HTN.  The patient was given clear instructions to go to ER or return to medical center if symptoms don't improve, worsen or new problems develop. The patient verbalized understanding. The patient was told to call to get lab results if they haven't heard anything in the next week.   This note has been created with Education officer, environmental. Any transcriptional errors are unintentional.    Jeanann Lewandowsky, MD, MHA, FACP, FAAP [facility] and [facility] [place], [place] [phone]   04/28/2014, 2:57 PM

## 2014-04-28 NOTE — Progress Notes (Signed)
Pt is here following up on her HTN. Pt states that she is having frequent headaches.

## 2014-04-28 NOTE — Progress Notes (Signed)
Patient ID: Belinda Rodriguez, female   DOB: 03/14/1956, 58 y.o.   MRN: [id number]   Belinda Rodriguez, is a 58 y.o. female  [id number]  NFA:[id number]  DOB - 07/21/1956  Chief Complaint  Patient presents with  . Follow-up        Subjective:   Belinda Rodriguez is a 58 y.o. female here today for a follow up on HTN.  PMH significant for HTN, sinusitis, GERD, asthma, and OA.  She has no complaints regarding her HTN or medication regimen, and reports compliance with her medications.  She denies CP, SOB, DOE.  She states that her legs occasionally swell with prolonged standing/walking.  She follows a heart healthy diet and reports a 3 lb weight loss.  She does not currently exercise.  She endorses chronic allergies which lead to sinus headaches.  She does not take medication for her headaches and declines treatment at this time.  She also complains of carpal tunnel-like symptoms.  She recently had a home visit from a provider with her insurance company who preformed the Phalens test, which resulted in numbness in her finger tips.  She was encouraged to see her PCP regarding these findings.  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 She states that the numbness in her fingers is worse at night when she is resting.  Movement of her fingers relieves these feelings.  She denies headache, abdominal pain, nausea, cough, and SOB.  No problems updated.  ALLERGIES: Allergies  Allergen Reactions  . Nsaids   . Zestril [Lisinopril]     PAST MEDICAL HISTORY: Past Medical History  Diagnosis Date  . Arthritis   . Hypertension   . Asthma   . Seasonal allergies     MEDICATIONS AT HOME: Prior to Admission medications   Medication Sig Start Date End Date Taking? Authorizing Provider  albuterol (PROVENTIL HFA;VENTOLIN HFA) 108 (90 BASE) MCG/ACT inhaler Inhale 2 puffs into the lungs every 6 (six) hours as needed for wheezing. 11/25/13  Yes Quentin Angstlugbemiga E Jegede, MD  amLODipine  (NORVASC) 10 MG tablet Take 1 tablet (10 mg total) by mouth daily. 11/25/13  Yes Quentin Angstlugbemiga E Jegede, MD  atorvastatin (LIPITOR) 10 MG tablet Take 1 tablet (10 mg total) by mouth daily. 11/25/13  Yes Quentin Angstlugbemiga E Jegede, MD  Diclofenac-Misoprostol (ARTHROTEC) 75-0.2 MG TBEC Take 1 tablet by mouth 2 (two) times daily. 07/21/13  Yes Quentin Angstlugbemiga E Jegede, MD  fluticasone (FLONASE) 50 MCG/ACT nasal spray Place 2 sprays into both nostrils daily. 11/25/13  Yes Quentin Angstlugbemiga E Jegede, MD  hydrochlorothiazide (HYDRODIURIL) 25 MG tablet Take 1 tablet (25 mg total) by mouth daily. 11/25/13  Yes Quentin Angstlugbemiga E Jegede, MD  loratadine (CLARITIN) 10 MG tablet Take 1 tablet (10 mg total) by mouth daily. 10/18/13  Yes Richarda OverlieNayana Abrol, MD  omeprazole (PRILOSEC) 20 MG capsule Take 1 capsule (20 mg total) by mouth 2 (two) times daily before a meal. 07/19/13  Yes Quentin Angstlugbemiga E Jegede, MD  Potassium Chloride ER 20 MEQ TBCR Take 20 mEq by mouth daily. 11/25/13  Yes Quentin Angstlugbemiga E Jegede, MD   Review of Systems  Constitutional: Negative.   HENT: Negative for congestion, hearing loss, sore throat and tinnitus.        Sinus headaches with seasonal changes  Eyes: Negative.   Respiratory: Negative.   Cardiovascular: Positive for leg swelling.       With prolonged standing  Gastrointestinal: Negative.   Genitourinary: Negative.   Musculoskeletal: Negative.   Skin:  Negative.   Neurological: Positive for headaches.  Psychiatric/Behavioral: Negative.      Objective:   Filed Vitals:   04/28/14 1029  BP: 125/86  Pulse: 95  Temp: 98.6 F (37 C)  TempSrc: Oral  Resp: 16  Height: 5\' 2"  (1.575 m)  Weight: 205 lb (92.987 kg)  SpO2: 97%    Exam General appearance : Awake, alert, not in any distress. Speech Clear. Not toxic looking HEENT: Atraumatic and Normocephalic, pupils equally reactive to light and accomodation Neck: supple, no JVD. No cervical lymphadenopathy.  Chest:Good air entry bilaterally, no added sounds  CVS: S1  S2 regular, no murmurs.  Abdomen: Bowel sounds present, Non tender and not distended with no gaurding, rigidity or rebound. Extremities: B/L Lower Ext shows no edema, both legs are warm to touch. Phalens positive for numbness in fingertips, Tinels negative Neurology: Awake alert, and oriented X 3, CN II-XII intact, Non focal Skin:No Rash Wounds:N/A  Data Review No results found for this basename: HGBA1C     Assessment & Plan   1. HTN-   P: Well controlled on current regimen.  Discussed DASH diet and exercise.    2. Hypokalemia  P: Pt currently taking K-Dur 20 mEq daily. BMET to follow  3. Carpal Tunnel:  P: Pt instructed to purchase wrist splints and wear only at night to promote decompression of nerve.   4. Preventative Screenings  P: Flu shot given The patient had an appt for a mammogram and needed to reschedule.  She will call to make this appt. She was referred for colonscopy at her last visit but has not been completed yet.  She will be travelling extensively over the next several months and will call the office when she returns for a referral to be placed.     Charlena Cross, FNP -student  [phone]   04/28/2014, 2:54 PM

## 2014-04-28 NOTE — Patient Instructions (Signed)
DASH Eating Plan DASH stands for "Dietary Approaches to Stop Hypertension." The DASH eating plan is a healthy eating plan that has been shown to reduce high blood pressure (hypertension). Additional health benefits may include reducing the risk of type 2 diabetes mellitus, heart disease, and stroke. The DASH eating plan may also help with weight loss. WHAT DO I NEED TO KNOW ABOUT THE DASH EATING PLAN? For the DASH eating plan, you will follow these general guidelines:  Choose foods with a percent daily value for sodium of less than 5% (as listed on the food label).  Use salt-free seasonings or herbs instead of table salt or sea salt.  Check with your health care provider or pharmacist before using salt substitutes.  Eat lower-sodium products, often labeled as "lower sodium" or "no salt added."  Eat fresh foods.  Eat more vegetables, fruits, and low-fat dairy products.  Choose whole grains. Look for the word "whole" as the first word in the ingredient list.  Choose fish and skinless chicken or turkey more often than red meat. Limit fish, poultry, and meat to 6 oz (170 g) each day.  Limit sweets, desserts, sugars, and sugary drinks.  Choose heart-healthy fats.  Limit cheese to 1 oz (28 g) per day.  Eat more home-cooked food and less restaurant, buffet, and fast food.  Limit fried foods.  Cook foods using methods other than frying.  Limit canned vegetables. If you do use them, rinse them well to decrease the sodium.  When eating at a restaurant, ask that your food be prepared with less salt, or no salt if possible. WHAT FOODS CAN I EAT? Seek help from a dietitian for individual calorie needs. Grains Whole grain or whole wheat bread. Brown rice. Whole grain or whole wheat pasta. Quinoa, bulgur, and whole grain cereals. Low-sodium cereals. Corn or whole wheat flour tortillas. Whole grain cornbread. Whole grain crackers. Low-sodium crackers. Vegetables Fresh or frozen vegetables  (raw, steamed, roasted, or grilled). Low-sodium or reduced-sodium tomato and vegetable juices. Low-sodium or reduced-sodium tomato sauce and paste. Low-sodium or reduced-sodium canned vegetables.  Fruits All fresh, canned (in natural juice), or frozen fruits. Meat and Other Protein Products Ground beef (85% or leaner), grass-fed beef, or beef trimmed of fat. Skinless chicken or turkey. Ground chicken or turkey. Pork trimmed of fat. All fish and seafood. Eggs. Dried beans, peas, or lentils. Unsalted nuts and seeds. Unsalted canned beans. Dairy Low-fat dairy products, such as skim or 1% milk, 2% or reduced-fat cheeses, low-fat ricotta or cottage cheese, or plain low-fat yogurt. Low-sodium or reduced-sodium cheeses. Fats and Oils Tub margarines without trans fats. Light or reduced-fat mayonnaise and salad dressings (reduced sodium). Avocado. Safflower, olive, or canola oils. Natural peanut or almond butter. Other Unsalted popcorn and pretzels. The items listed above may not be a complete list of recommended foods or beverages. Contact your dietitian for more options. WHAT FOODS ARE NOT RECOMMENDED? Grains White bread. White pasta. White rice. Refined cornbread. Bagels and croissants. Crackers that contain trans fat. Vegetables Creamed or fried vegetables. Vegetables in a cheese sauce. Regular canned vegetables. Regular canned tomato sauce and paste. Regular tomato and vegetable juices. Fruits Dried fruits. Canned fruit in light or heavy syrup. Fruit juice. Meat and Other Protein Products Fatty cuts of meat. Ribs, chicken wings, bacon, sausage, bologna, salami, chitterlings, fatback, hot dogs, bratwurst, and packaged luncheon meats. Salted nuts and seeds. Canned beans with salt. Dairy Whole or 2% milk, cream, half-and-half, and cream cheese. Whole-fat or sweetened yogurt. Full-fat   cheeses or blue cheese. Nondairy creamers and whipped toppings. Processed cheese, cheese spreads, or cheese  curds. Condiments Onion and garlic salt, seasoned salt, table salt, and sea salt. Canned and packaged gravies. Worcestershire sauce. Tartar sauce. Barbecue sauce. Teriyaki sauce. Soy sauce, including reduced sodium. Steak sauce. Fish sauce. Oyster sauce. Cocktail sauce. Horseradish. Ketchup and mustard. Meat flavorings and tenderizers. Bouillon cubes. Hot sauce. Tabasco sauce. Marinades. Taco seasonings. Relishes. Fats and Oils Butter, stick margarine, lard, shortening, ghee, and bacon fat. Coconut, palm kernel, or palm oils. Regular salad dressings. Other Pickles and olives. Salted popcorn and pretzels. The items listed above may not be a complete list of foods and beverages to avoid. Contact your dietitian for more information. WHERE CAN I FIND MORE INFORMATION? National Heart, Lung, and Blood Institute: www.nhlbi.nih.gov/health/health-topics/topics/dash/ Document Released: 07/04/2011 Document Revised: 11/29/2013 Document Reviewed: 05/19/2013 ExitCare Patient Information 2015 [employer]. This information is not intended to replace advice given to you by your health care provider. Make sure you discuss any questions you have with your health care provider. Hypertension Hypertension, commonly called high blood pressure, is when the force of blood pumping through your arteries is too strong. Your arteries are the blood vessels that carry blood from your heart throughout your body. A blood pressure reading consists of a higher number over a lower number, such as 110/72. The higher number (systolic) is the pressure inside your arteries when your heart pumps. The lower number (diastolic) is the pressure inside your arteries when your heart relaxes. Ideally you want your blood pressure below 120/80. Hypertension forces your heart to work harder to pump blood. Your arteries may become narrow or stiff. Having hypertension puts you at risk for heart disease, stroke, and other problems.  RISK  FACTORS Some risk factors for high blood pressure are controllable. Others are not.  Risk factors you cannot control include:   Race. You may be at higher risk if you are African American.  Age. Risk increases with age.  Gender. Men are at higher risk than women before age 45 years. After age 65, women are at higher risk than men. Risk factors you can control include:  Not getting enough exercise or physical activity.  Being overweight.  Getting too much fat, sugar, calories, or salt in your diet.  Drinking too much alcohol. SIGNS AND SYMPTOMS Hypertension does not usually cause signs or symptoms. Extremely high blood pressure (hypertensive crisis) may cause headache, anxiety, shortness of breath, and nosebleed. DIAGNOSIS  To check if you have hypertension, your health care provider will measure your blood pressure while you are seated, with your arm held at the level of your heart. It should be measured at least twice using the same arm. Certain conditions can cause a difference in blood pressure between your right and left arms. A blood pressure reading that is higher than normal on one occasion does not mean that you need treatment. If one blood pressure reading is high, ask your health care provider about having it checked again. TREATMENT  Treating high blood pressure includes making lifestyle changes and possibly taking medicine. Living a healthy lifestyle can help lower high blood pressure. You may need to change some of your habits. Lifestyle changes may include:  Following the DASH diet. This diet is high in fruits, vegetables, and whole grains. It is low in salt, red meat, and added sugars.  Getting at least 2 hours of brisk physical activity every week.  Losing weight if necessary.  Not smoking.  Limiting   alcoholic beverages.  Learning ways to reduce stress. If lifestyle changes are not enough to get your blood pressure under control, your health care provider may  prescribe medicine. You may need to take more than one. Work closely with your health care provider to understand the risks and benefits. HOME CARE INSTRUCTIONS  Have your blood pressure rechecked as directed by your health care provider.   Take medicines only as directed by your health care provider. Follow the directions carefully. Blood pressure medicines must be taken as prescribed. The medicine does not work as well when you skip doses. Skipping doses also puts you at risk for problems.   Do not smoke.   Monitor your blood pressure at home as directed by your health care provider. SEEK MEDICAL CARE IF:   You think you are having a reaction to medicines taken.  You have recurrent headaches or feel dizzy.  You have swelling in your ankles.  You have trouble with your vision. SEEK IMMEDIATE MEDICAL CARE IF:  You develop a severe headache or confusion.  You have unusual weakness, numbness, or feel faint.  You have severe chest or abdominal pain.  You vomit repeatedly.  You have trouble breathing. MAKE SURE YOU:   Understand these instructions.  Will watch your condition.  Will get help right away if you are not doing well or get worse. Document Released: 07/15/2005 Document Revised: 11/29/2013 Document Reviewed: 05/07/2013 ExitCare Patient Information 2015 [employer]. This information is not intended to replace advice given to you by your health care provider. Make sure you discuss any questions you have with your health care provider.  

## 2014-05-13 ENCOUNTER — Telehealth: Payer: Self-pay | Admitting: Emergency Medicine

## 2014-05-13 NOTE — Telephone Encounter (Signed)
Message copied by Darlis LoanSMITH, JILL D on Fri May 13, 2014  5:30 PM ------      Message from: Jeanann LewandowskyJEGEDE, OLUGBEMIGA E      Created: Wed May 11, 2014  5:40 PM       Please inform patient that her laboratory tests is within normal limit, her potassium is normal. ------

## 2014-05-13 NOTE — Telephone Encounter (Signed)
Pt given normal lab results 

## 2014-12-06 ENCOUNTER — Encounter: Payer: Self-pay | Admitting: Internal Medicine

## 2014-12-06 ENCOUNTER — Ambulatory Visit: Payer: No Typology Code available for payment source | Attending: Internal Medicine | Admitting: Internal Medicine

## 2014-12-06 VITALS — BP 129/86 | HR 86 | Temp 98.3°F | Resp 16 | Ht 62.0 in | Wt 201.0 lb

## 2014-12-06 DIAGNOSIS — E785 Hyperlipidemia, unspecified: Secondary | ICD-10-CM | POA: Insufficient documentation

## 2014-12-06 DIAGNOSIS — I10 Essential (primary) hypertension: Secondary | ICD-10-CM | POA: Diagnosis not present

## 2014-12-06 DIAGNOSIS — Z9109 Other allergy status, other than to drugs and biological substances: Secondary | ICD-10-CM | POA: Insufficient documentation

## 2014-12-06 DIAGNOSIS — K219 Gastro-esophageal reflux disease without esophagitis: Secondary | ICD-10-CM | POA: Insufficient documentation

## 2014-12-06 DIAGNOSIS — J4531 Mild persistent asthma with (acute) exacerbation: Secondary | ICD-10-CM | POA: Insufficient documentation

## 2014-12-06 DIAGNOSIS — Z91048 Other nonmedicinal substance allergy status: Secondary | ICD-10-CM | POA: Diagnosis not present

## 2014-12-06 DIAGNOSIS — M158 Other polyosteoarthritis: Secondary | ICD-10-CM | POA: Diagnosis not present

## 2014-12-06 LAB — COMPLETE METABOLIC PANEL WITH GFR
ALK PHOS: 73 U/L (ref 39–117)
ALT: 21 U/L (ref 0–35)
AST: 19 U/L (ref 0–37)
Albumin: 4.1 g/dL (ref 3.5–5.2)
BUN: 14 mg/dL (ref 6–23)
CO2: 32 mEq/L (ref 19–32)
Calcium: 10 mg/dL (ref 8.4–10.5)
Chloride: 96 mEq/L (ref 96–112)
Creat: 0.79 mg/dL (ref 0.50–1.10)
GFR, Est African American: >89 mL/min
GFR, Est Non African American: 81 mL/min
Glucose, Bld: 98 mg/dL (ref 70–99)
Potassium: 3.6 mEq/L (ref 3.5–5.3)
SODIUM: 140 meq/L (ref 135–145)
TOTAL PROTEIN: 7.9 g/dL (ref 6.0–8.3)
Total Bilirubin: 0.4 mg/dL (ref 0.2–1.2)

## 2014-12-06 MED ORDER — DICLOFENAC-MISOPROSTOL 75-0.2 MG PO TBEC: 1.0000 | DELAYED_RELEASE_TABLET | Freq: Two times a day (BID) | ORAL | Status: DC

## 2014-12-06 MED ORDER — HYDROCHLOROTHIAZIDE 25 MG PO TABS: 25.0000 mg | ORAL_TABLET | Freq: Every day | ORAL | Status: DC

## 2014-12-06 MED ORDER — ALBUTEROL SULFATE HFA 108 (90 BASE) MCG/ACT IN AERS: 2.0000 | INHALATION_SPRAY | Freq: Four times a day (QID) | RESPIRATORY_TRACT | Status: DC | PRN

## 2014-12-06 MED ORDER — AMLODIPINE BESYLATE 10 MG PO TABS: 10.0000 mg | ORAL_TABLET | Freq: Every day | ORAL | Status: DC

## 2014-12-06 MED ORDER — DICLOFENAC-MISOPROSTOL 75-0.2 MG PO TBEC: 1.0000 | DELAYED_RELEASE_TABLET | Freq: Two times a day (BID) | ORAL | Status: AC

## 2014-12-06 MED ORDER — POTASSIUM CHLORIDE ER 20 MEQ PO TBCR: 20.0000 meq | EXTENDED_RELEASE_TABLET | Freq: Every day | ORAL | Status: DC

## 2014-12-06 MED ORDER — ATORVASTATIN CALCIUM 10 MG PO TABS: 10.0000 mg | ORAL_TABLET | Freq: Every day | ORAL | Status: DC

## 2014-12-06 MED ORDER — FLUTICASONE PROPIONATE 50 MCG/ACT NA SUSP: 2.0000 | Freq: Every day | NASAL | Status: AC

## 2014-12-06 MED ORDER — OMEPRAZOLE 20 MG PO CPDR: 20.0000 mg | DELAYED_RELEASE_CAPSULE | Freq: Two times a day (BID) | ORAL | Status: AC

## 2014-12-06 MED ORDER — LORATADINE 10 MG PO TABS: 10.0000 mg | ORAL_TABLET | Freq: Every day | ORAL | Status: AC

## 2014-12-06 NOTE — Patient Instructions (Signed)
DASH Eating Plan DASH stands for "Dietary Approaches to Stop Hypertension." The DASH eating plan is a healthy eating plan that has been shown to reduce high blood pressure (hypertension). Additional health benefits may include reducing the risk of type 2 diabetes mellitus, heart disease, and stroke. The DASH eating plan may also help with weight loss. WHAT DO I NEED TO KNOW ABOUT THE DASH EATING PLAN? For the DASH eating plan, you will follow these general guidelines:  Choose foods with a percent daily value for sodium of less than 5% (as listed on the food label).  Use salt-free seasonings or herbs instead of table salt or sea salt.  Check with your health care provider or pharmacist before using salt substitutes.  Eat lower-sodium products, often labeled as "lower sodium" or "no salt added."  Eat fresh foods.  Eat more vegetables, fruits, and low-fat dairy products.  Choose whole grains. Look for the word "whole" as the first word in the ingredient list.  Choose fish and skinless chicken or turkey more often than red meat. Limit fish, poultry, and meat to 6 oz (170 g) each day.  Limit sweets, desserts, sugars, and sugary drinks.  Choose heart-healthy fats.  Limit cheese to 1 oz (28 g) per day.  Eat more home-cooked food and less restaurant, buffet, and fast food.  Limit fried foods.  Cook foods using methods other than frying.  Limit canned vegetables. If you do use them, rinse them well to decrease the sodium.  When eating at a restaurant, ask that your food be prepared with less salt, or no salt if possible. WHAT FOODS CAN I EAT? Seek help from a dietitian for individual calorie needs. Grains Whole grain or whole wheat bread. Brown rice. Whole grain or whole wheat pasta. Quinoa, bulgur, and whole grain cereals. Low-sodium cereals. Corn or whole wheat flour tortillas. Whole grain cornbread. Whole grain crackers. Low-sodium crackers. Vegetables Fresh or frozen vegetables  (raw, steamed, roasted, or grilled). Low-sodium or reduced-sodium tomato and vegetable juices. Low-sodium or reduced-sodium tomato sauce and paste. Low-sodium or reduced-sodium canned vegetables.  Fruits All fresh, canned (in natural juice), or frozen fruits. Meat and Other Protein Products Ground beef (85% or leaner), grass-fed beef, or beef trimmed of fat. Skinless chicken or turkey. Ground chicken or turkey. Pork trimmed of fat. All fish and seafood. Eggs. Dried beans, peas, or lentils. Unsalted nuts and seeds. Unsalted canned beans. Dairy Low-fat dairy products, such as skim or 1% milk, 2% or reduced-fat cheeses, low-fat ricotta or cottage cheese, or plain low-fat yogurt. Low-sodium or reduced-sodium cheeses. Fats and Oils Tub margarines without trans fats. Light or reduced-fat mayonnaise and salad dressings (reduced sodium). Avocado. Safflower, olive, or canola oils. Natural peanut or almond butter. Other Unsalted popcorn and pretzels. The items listed above may not be a complete list of recommended foods or beverages. Contact your dietitian for more options. WHAT FOODS ARE NOT RECOMMENDED? Grains White bread. White pasta. White rice. Refined cornbread. Bagels and croissants. Crackers that contain trans fat. Vegetables Creamed or fried vegetables. Vegetables in a cheese sauce. Regular canned vegetables. Regular canned tomato sauce and paste. Regular tomato and vegetable juices. Fruits Dried fruits. Canned fruit in light or heavy syrup. Fruit juice. Meat and Other Protein Products Fatty cuts of meat. Ribs, chicken wings, bacon, sausage, bologna, salami, chitterlings, fatback, hot dogs, bratwurst, and packaged luncheon meats. Salted nuts and seeds. Canned beans with salt. Dairy Whole or 2% milk, cream, half-and-half, and cream cheese. Whole-fat or sweetened yogurt. Full-fat   cheeses or blue cheese. Nondairy creamers and whipped toppings. Processed cheese, cheese spreads, or cheese  curds. Condiments Onion and garlic salt, seasoned salt, table salt, and sea salt. Canned and packaged gravies. Worcestershire sauce. Tartar sauce. Barbecue sauce. Teriyaki sauce. Soy sauce, including reduced sodium. Steak sauce. Fish sauce. Oyster sauce. Cocktail sauce. Horseradish. Ketchup and mustard. Meat flavorings and tenderizers. Bouillon cubes. Hot sauce. Tabasco sauce. Marinades. Taco seasonings. Relishes. Fats and Oils Butter, stick margarine, lard, shortening, ghee, and bacon fat. Coconut, palm kernel, or palm oils. Regular salad dressings. Other Pickles and olives. Salted popcorn and pretzels. The items listed above may not be a complete list of foods and beverages to avoid. Contact your dietitian for more information. WHERE CAN I FIND MORE INFORMATION? National Heart, Lung, and Blood Institute: www.nhlbi.nih.gov/health/health-topics/topics/dash/ Document Released: 07/04/2011 Document Revised: 11/29/2013 Document Reviewed: 05/19/2013 ExitCare Patient Information 2015 [employer]. This information is not intended to replace advice given to you by your health care provider. Make sure you discuss any questions you have with your health care provider. Hypertension Hypertension, commonly called high blood pressure, is when the force of blood pumping through your arteries is too strong. Your arteries are the blood vessels that carry blood from your heart throughout your body. A blood pressure reading consists of a higher number over a lower number, such as 110/72. The higher number (systolic) is the pressure inside your arteries when your heart pumps. The lower number (diastolic) is the pressure inside your arteries when your heart relaxes. Ideally you want your blood pressure below 120/80. Hypertension forces your heart to work harder to pump blood. Your arteries may become narrow or stiff. Having hypertension puts you at risk for heart disease, stroke, and other problems.  RISK  FACTORS Some risk factors for high blood pressure are controllable. Others are not.  Risk factors you cannot control include:   Race. You may be at higher risk if you are African American.  Age. Risk increases with age.  Gender. Men are at higher risk than women before age 45 years. After age 65, women are at higher risk than men. Risk factors you can control include:  Not getting enough exercise or physical activity.  Being overweight.  Getting too much fat, sugar, calories, or salt in your diet.  Drinking too much alcohol. SIGNS AND SYMPTOMS Hypertension does not usually cause signs or symptoms. Extremely high blood pressure (hypertensive crisis) may cause headache, anxiety, shortness of breath, and nosebleed. DIAGNOSIS  To check if you have hypertension, your health care provider will measure your blood pressure while you are seated, with your arm held at the level of your heart. It should be measured at least twice using the same arm. Certain conditions can cause a difference in blood pressure between your right and left arms. A blood pressure reading that is higher than normal on one occasion does not mean that you need treatment. If one blood pressure reading is high, ask your health care provider about having it checked again. TREATMENT  Treating high blood pressure includes making lifestyle changes and possibly taking medicine. Living a healthy lifestyle can help lower high blood pressure. You may need to change some of your habits. Lifestyle changes may include:  Following the DASH diet. This diet is high in fruits, vegetables, and whole grains. It is low in salt, red meat, and added sugars.  Getting at least 2 hours of brisk physical activity every week.  Losing weight if necessary.  Not smoking.  Limiting   alcoholic beverages.  Learning ways to reduce stress. If lifestyle changes are not enough to get your blood pressure under control, your health care provider may  prescribe medicine. You may need to take more than one. Work closely with your health care provider to understand the risks and benefits. HOME CARE INSTRUCTIONS  Have your blood pressure rechecked as directed by your health care provider.   Take medicines only as directed by your health care provider. Follow the directions carefully. Blood pressure medicines must be taken as prescribed. The medicine does not work as well when you skip doses. Skipping doses also puts you at risk for problems.   Do not smoke.   Monitor your blood pressure at home as directed by your health care provider. SEEK MEDICAL CARE IF:   You think you are having a reaction to medicines taken.  You have recurrent headaches or feel dizzy.  You have swelling in your ankles.  You have trouble with your vision. SEEK IMMEDIATE MEDICAL CARE IF:  You develop a severe headache or confusion.  You have unusual weakness, numbness, or feel faint.  You have severe chest or abdominal pain.  You vomit repeatedly.  You have trouble breathing. MAKE SURE YOU:   Understand these instructions.  Will watch your condition.  Will get help right away if you are not doing well or get worse. Document Released: 07/15/2005 Document Revised: 11/29/2013 Document Reviewed: 05/07/2013 ExitCare Patient Information 2015 [employer]. This information is not intended to replace advice given to you by your health care provider. Make sure you discuss any questions you have with your health care provider.  

## 2014-12-06 NOTE — Progress Notes (Signed)
Pt is here following up on her HTN, arthritis and her asthma.

## 2014-12-06 NOTE — Progress Notes (Signed)
Patient ID: Belinda Rodriguez Holifield, female   DOB: 04/07/1956, 59 y.o.   MRN: [id number]   Belinda Rodriguez Latino, is a 59 y.o. female  [id number]  [id number]  DOB - 03/10/1956  Chief Complaint  Patient presents with  . Follow-up        Subjective:   Belinda Rodriguez Fuston is a 59 y.o. female here today for a follow up visit. Patient has history of hypertension, chronic sinusitis, GERD asthma and osteoarthritis. She is here today for routine follow-up. She has no significant complaint. She claims compliance with medications, reports no side effects. She requests a refill on all her medications. Patient has No headache, No chest pain, No abdominal pain - No Nausea, No new weakness tingling or numbness, No Cough - SOB.  Problem  Dyslipidemia  Environmental Allergies    ALLERGIES: Allergies  Allergen Reactions  . Nsaids   . Zestril [Lisinopril]     PAST MEDICAL HISTORY: Past Medical History  Diagnosis Date  . Arthritis   . Hypertension   . Asthma   . Seasonal allergies     MEDICATIONS AT HOME: Prior to Admission medications   Medication Sig Start Date End Date Taking? Authorizing Provider  albuterol (PROVENTIL HFA;VENTOLIN HFA) 108 (90 BASE) MCG/ACT inhaler Inhale 2 puffs into the lungs every 6 (six) hours as needed for wheezing. 12/06/14  Yes Quentin Angstlugbemiga E Junette Bernat, MD  amLODipine (NORVASC) 10 MG tablet Take 1 tablet (10 mg total) by mouth daily. 12/06/14  Yes Quentin Angstlugbemiga E Carron Jaggi, MD  atorvastatin (LIPITOR) 10 MG tablet Take 1 tablet (10 mg total) by mouth daily. 12/06/14  Yes Quentin Angstlugbemiga E Derrius Furtick, MD  Diclofenac-Misoprostol (ARTHROTEC) 75-0.2 MG TBEC Take 1 tablet by mouth 2 (two) times daily. 12/06/14  Yes Quentin Angstlugbemiga E Malavika Lira, MD  fluticasone (FLONASE) 50 MCG/ACT nasal spray Place 2 sprays into both nostrils daily. 12/06/14  Yes Quentin Angstlugbemiga E Tiea Manninen, MD  hydrochlorothiazide (HYDRODIURIL) 25 MG tablet Take 1 tablet (25 mg total) by mouth daily. 12/06/14  Yes Quentin Angstlugbemiga E Keirstyn Aydt, MD  loratadine (CLARITIN) 10 MG  tablet Take 1 tablet (10 mg total) by mouth daily. 12/06/14  Yes Quentin Angstlugbemiga E Sharde Gover, MD  omeprazole (PRILOSEC) 20 MG capsule Take 1 capsule (20 mg total) by mouth 2 (two) times daily before a meal. 12/06/14  Yes Quentin Angstlugbemiga E Ronasia Isola, MD  Potassium Chloride ER 20 MEQ TBCR Take 20 mEq by mouth daily. 12/06/14  Yes Quentin Angstlugbemiga E Mahathi Pokorney, MD     Objective:   Filed Vitals:   12/06/14 1008  BP: 129/86  Pulse: 86  Temp: 98.3 F (36.8 C)  TempSrc: Oral  Resp: 16  Height: 5\' 2"  (1.575 m)  Weight: 201 lb (91.173 kg)  SpO2: 96%    Exam General appearance : Awake, alert, not in any distress. Speech Clear. Not toxic looking HEENT: Atraumatic and Normocephalic, pupils equally reactive to light and accomodation Neck: supple, no JVD. No cervical lymphadenopathy.  Chest:Good air entry bilaterally, no added sounds  CVS: S1 S2 regular, no murmurs.  Abdomen: Bowel sounds present, Non tender and not distended with no gaurding, rigidity or rebound. Extremities: B/L Lower Ext shows no edema, both legs are warm to touch Neurology: Awake alert, and oriented X 3, CN II-XII intact, Non focal Skin:No Rash  Data Review No results found for: HGBA1C   Assessment & Plan   1. Essential hypertension  - hydrochlorothiazide (HYDRODIURIL) 25 MG tablet; Take 1 tablet (25 mg total) by mouth daily.  Dispense: 90 tablet; Refill: 3 - Potassium Chloride ER 20 MEQ  TBCR; Take 20 mEq by mouth daily.  Dispense: 30 tablet; Refill: 2 - amLODipine (NORVASC) 10 MG tablet; Take 1 tablet (10 mg total) by mouth daily.  Dispense: 90 tablet; Refill: 3  - We have discussed target BP range and blood pressure goal - I have advised patient to check BP regularly and to call us back or report to clinic if the numbers are consistently higher than 140/90  - We discussed the importance of compliance with medical therapy and DASH diet recommended, consequences of uncontrolled hypertension discussed.  - continue current BP medications  2.  Environmental allergies  - fluticasone (FLONASE) 50 MCG/ACT nasal spray; Place 2 sprays into both nostrils daily.  Dispense: 16 g; Refill: 6 - loratadine (CLARITIN) 10 MG tablet; Take 1 tablet (10 mg total) by mouth daily.  Dispense: 30 tablet; Refill: 3  3. Gastroesophageal reflux disease without esophagitis  - omeprazole (PRILOSEC) 20 MG capsule; Take 1 capsule (20 mg total) by mouth 2 (two) times daily before a meal.  Dispense: 120 capsule; Refill: 3  4. Dyslipidemia  - atorvastatin (LIPITOR) 10 MG tablet; Take 1 tablet (10 mg total) by mouth daily.  Dispense: 90 tablet; Refill: 3  To address this please limit saturated fat to no more than 7% of your calories, limit cholesterol to 200 mg/day, increase fiber and exercise as tolerated. If needed we may add another cholesterol lowering medication to your regimen.   5. Asthma with acute exacerbation, mild persistent  - albuterol (PROVENTIL HFA;VENTOLIN HFA) 108 (90 BASE) MCG/ACT inhaler; Inhale 2 puffs into the lungs every 6 (six) hours as needed for wheezing.  Dispense: 2 Inhaler; Refill: 1 year  6. Other osteoarthritis involving multiple joints  - Diclofenac-Misoprostol (ARTHROTEC) 75-0.2 MG TBEC; Take 1 tablet by mouth 2 (two) times daily.  Dispense: 180 tablet; Refill: 4  Patient have been counseled extensively about nutrition and exercise Return in about 6 months (around 06/08/2015), or if symptoms worsen or fail to improve, for Follow up HTN, Annual Physical.  The patient was given clear instructions to go to ER or return to medical center if symptoms don't improve, worsen or new problems develop. The patient verbalized understanding. The patient was told to call to get lab results if they haven't heard anything in the next week.   This note has been created with Education officer, environmentalDragon speech recognition software and smart phrase technology. Any transcriptional errors are unintentional.    Jeanann LewandowskyJEGEDE, Samariah Hokenson, MD, MHA, CPE, FACP, FAAP [facility] [place], [place] [phone]   12/06/2014, 11:05 AM

## 2014-12-09 ENCOUNTER — Telehealth: Payer: Self-pay | Admitting: Internal Medicine

## 2014-12-09 DIAGNOSIS — M158 Other polyosteoarthritis: Secondary | ICD-10-CM

## 2014-12-09 DIAGNOSIS — E785 Hyperlipidemia, unspecified: Secondary | ICD-10-CM

## 2014-12-09 DIAGNOSIS — I10 Essential (primary) hypertension: Secondary | ICD-10-CM

## 2014-12-09 DIAGNOSIS — J4531 Mild persistent asthma with (acute) exacerbation: Secondary | ICD-10-CM

## 2014-12-09 NOTE — Telephone Encounter (Signed)
Pt calling on to follow up on refill request. [facility] pharmacy stating they have not received scripts with all pre-authorizations. Please f/u with pt and pharmacy, ref order #: [id number].  Pt's order will be voided if they do not receive response by 12/13/14.

## 2014-12-21 ENCOUNTER — Telehealth: Payer: Self-pay | Admitting: *Deleted

## 2014-12-21 NOTE — Telephone Encounter (Signed)
-----   Message from Quentin Angstlugbemiga E Jegede, MD sent at 12/14/2014  5:35 PM EDT ----- Please inform patient that her complete metabolic panel is normal.

## 2014-12-21 NOTE — Telephone Encounter (Signed)
Relayed results to patient. 

## 2015-02-17 IMAGING — CR DG CHEST 2V
2 series · 2 of 2 positions shown · non-contrast
Comparison: None.

CLINICAL DATA: Bronchitis, asthma

EXAM:
CHEST  2 VIEW

[w chest pa]
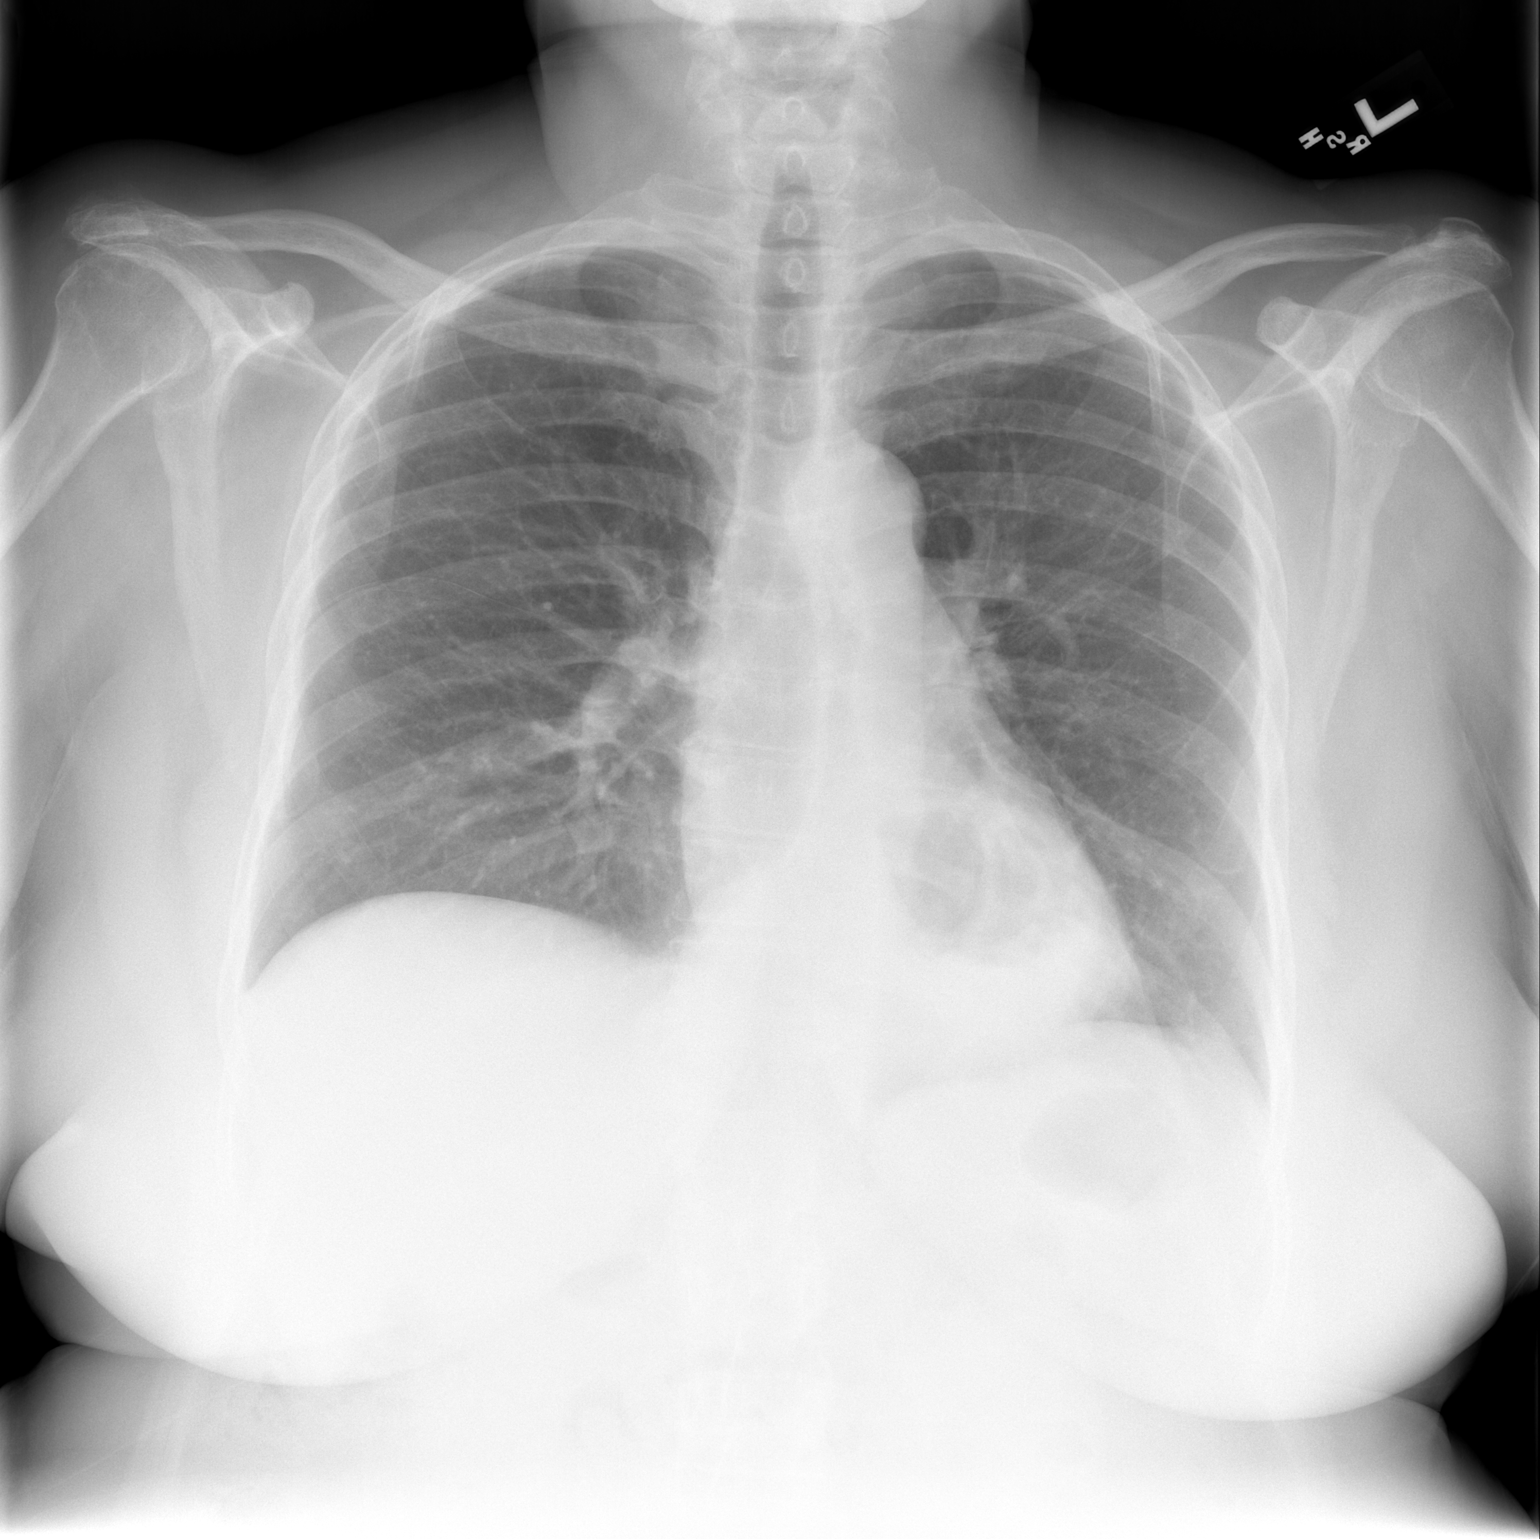

[w chest lat]
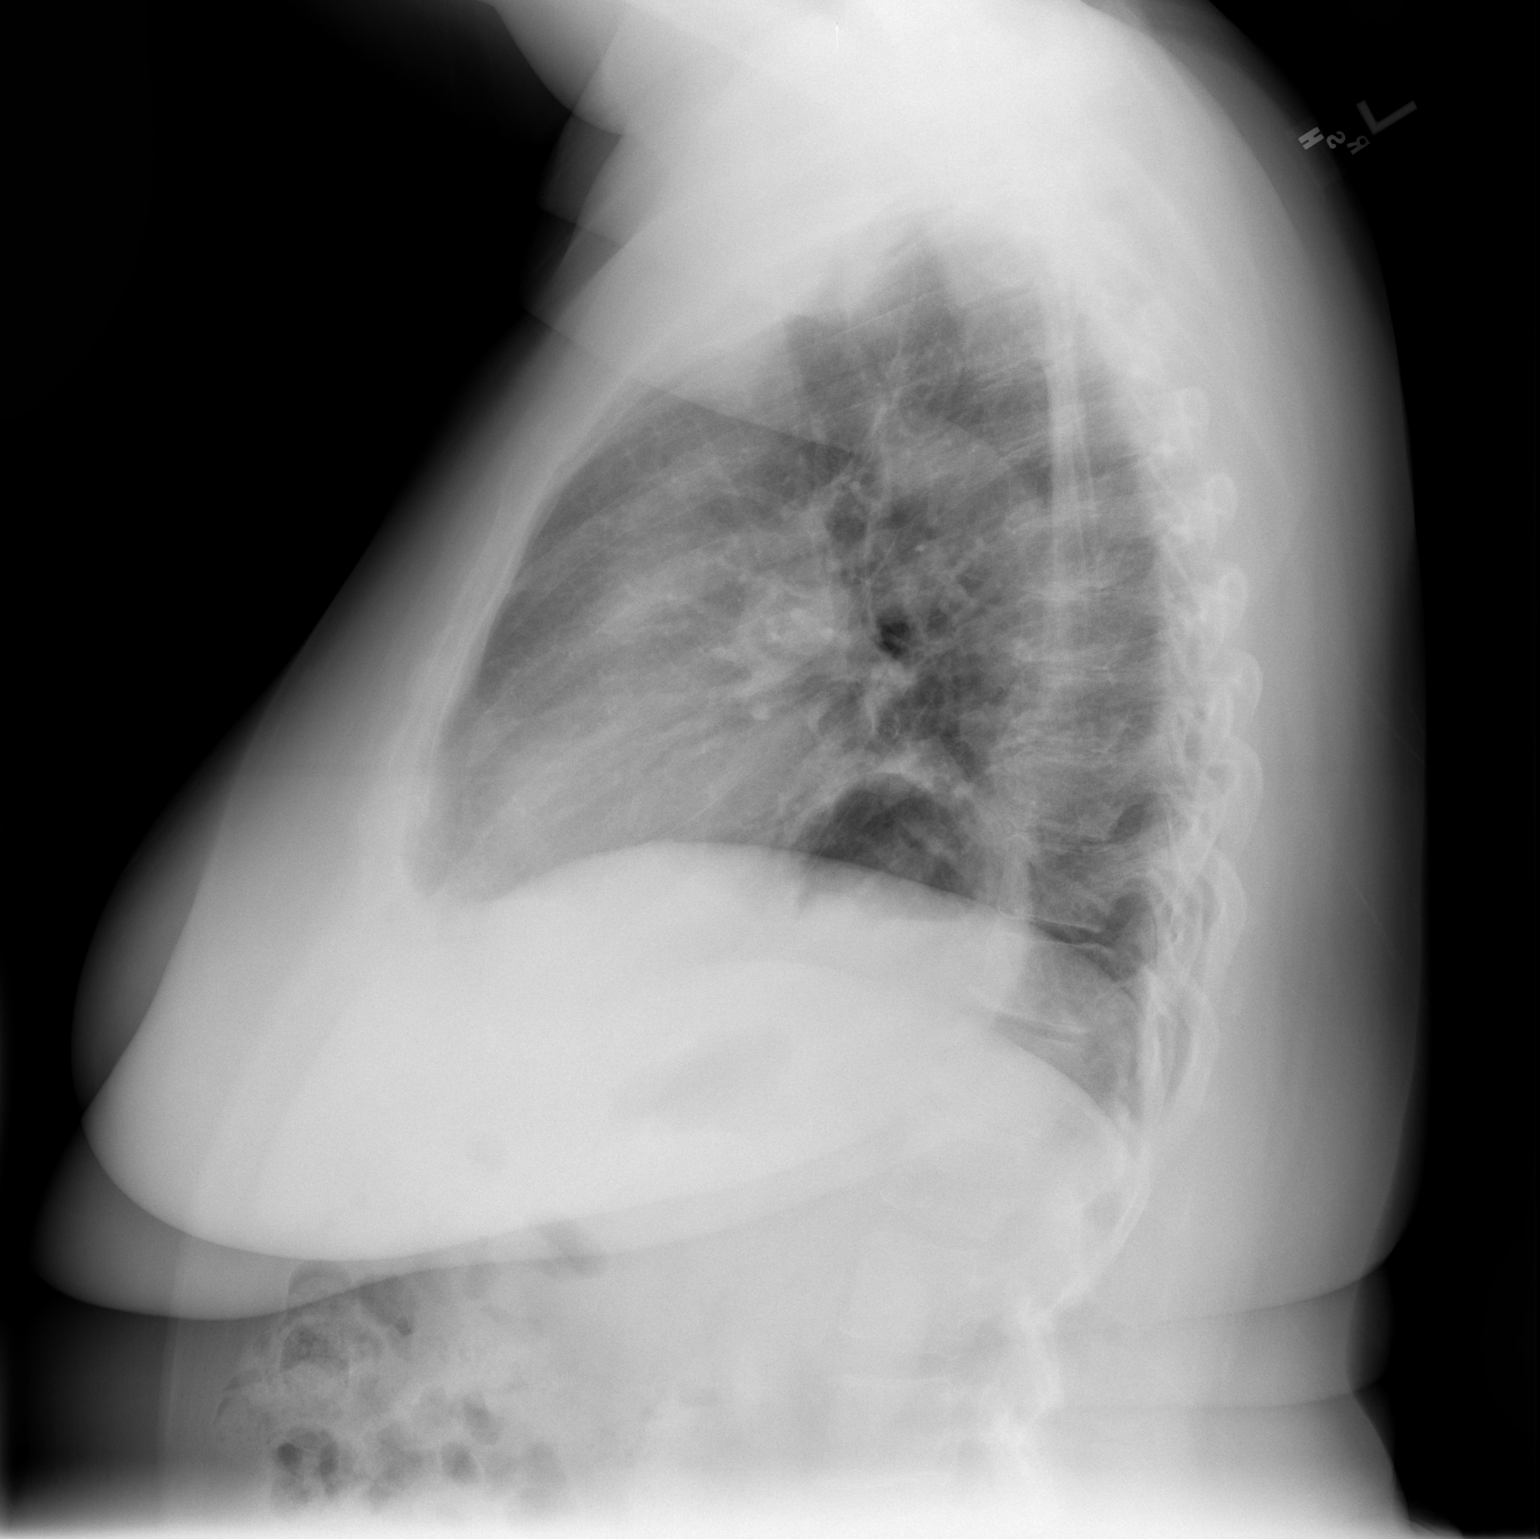

[2 of 2 positions shown; findings below may reference images not displayed]

FINDINGS: Cardiomediastinal silhouette is unremarkable. Moderate size hiatal
hernia with air-fluid level. Measures 6 cm. No acute infiltrate or
pulmonary edema. Mild degenerative changes thoracic spine.
IMPRESSION: No active cardiopulmonary disease.  Moderate size hiatal hernia.

## 2015-07-07 MED ORDER — POTASSIUM CHLORIDE ER 20 MEQ PO TBCR: 20.0000 meq | EXTENDED_RELEASE_TABLET | Freq: Every day | ORAL | Status: AC

## 2015-07-07 MED ORDER — ALBUTEROL SULFATE HFA 108 (90 BASE) MCG/ACT IN AERS: 2.0000 | INHALATION_SPRAY | Freq: Four times a day (QID) | RESPIRATORY_TRACT | Status: AC | PRN

## 2015-07-07 MED ORDER — AMLODIPINE BESYLATE 10 MG PO TABS: 10.0000 mg | ORAL_TABLET | Freq: Every day | ORAL | Status: AC

## 2015-07-07 MED ORDER — HYDROCHLOROTHIAZIDE 25 MG PO TABS: 25.0000 mg | ORAL_TABLET | Freq: Every day | ORAL | Status: AC

## 2015-07-07 MED ORDER — ATORVASTATIN CALCIUM 10 MG PO TABS: 10.0000 mg | ORAL_TABLET | Freq: Every day | ORAL | Status: AC

## 2015-07-07 NOTE — Telephone Encounter (Signed)
RN Placed call to patient to check on her wellbeing.  Patient states she has had a "rough year my sister died."  [place] and will get insurance the first of the year.  She says she has a small amount of medication.  This RN will refill medication Rx by Dr. Hyman HopesJegede for asthma, HTN, and asthma to her local [facility] and give her a 2 month supply until she can get her insurance and establish with a new PCP locally.  Patient appreciative.
